# Patient Record
Sex: Female | Born: 1995 | Race: Black or African American | Hispanic: No | Marital: Single | Smoking: Current every day smoker
Health system: Southern US, Community
[De-identification: ages and names within clinical notes are randomized; demographics above are authoritative.]

## PROBLEM LIST (undated history)

## (undated) ENCOUNTER — Inpatient Hospital Stay (HOSPITAL_COMMUNITY): Payer: Self-pay

## (undated) DIAGNOSIS — I10 Essential (primary) hypertension: Secondary | ICD-10-CM

## (undated) DIAGNOSIS — Z8669 Personal history of other diseases of the nervous system and sense organs: Secondary | ICD-10-CM

## (undated) HISTORY — PX: MOUTH SURGERY: SHX715

---
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
## 2013-11-30 ENCOUNTER — Encounter (HOSPITAL_COMMUNITY): Payer: Self-pay | Admitting: Emergency Medicine

## 2013-11-30 ENCOUNTER — Other Ambulatory Visit (HOSPITAL_COMMUNITY)
Admission: RE | Admit: 2013-11-30 | Discharge: 2013-11-30 | Disposition: A | Discharge status: Home / Self Care | Payer: Self-pay | Source: Ambulatory Visit | Attending: Family Medicine | Admitting: Family Medicine

## 2013-11-30 ENCOUNTER — Emergency Department (INDEPENDENT_AMBULATORY_CARE_PROVIDER_SITE_OTHER)
Admission: EM | Admit: 2013-11-30 | Discharge: 2013-11-30 | Disposition: A | Discharge status: Home / Self Care | Payer: Self-pay | Source: Home / Self Care | Attending: Family Medicine | Admitting: Family Medicine

## 2013-11-30 DIAGNOSIS — Z113 Encounter for screening for infections with a predominantly sexual mode of transmission: Secondary | ICD-10-CM | POA: Insufficient documentation

## 2013-11-30 DIAGNOSIS — N760 Acute vaginitis: Secondary | ICD-10-CM | POA: Insufficient documentation

## 2013-11-30 LAB — POCT URINALYSIS DIP (DEVICE)
BILIRUBIN URINE: NEGATIVE
Glucose, UA: NEGATIVE mg/dL
Hgb urine dipstick: NEGATIVE
Ketones, ur: NEGATIVE mg/dL
Nitrite: NEGATIVE
PH: 7 (ref 5.0–8.0)
Protein, ur: NEGATIVE mg/dL
Specific Gravity, Urine: 1.02 (ref 1.005–1.030)
Urobilinogen, UA: 0.2 mg/dL (ref 0.0–1.0)

## 2013-11-30 LAB — POCT PREGNANCY, URINE: Preg Test, Ur: NEGATIVE

## 2013-11-30 LAB — CERVICOVAGINAL ANCILLARY ONLY
WET PREP (BD AFFIRM): NEGATIVE
WET PREP (BD AFFIRM): NEGATIVE
Wet Prep (BD Affirm): NEGATIVE

## 2013-11-30 MED ORDER — METRONIDAZOLE 500 MG PO TABS: 500.0000 mg | ORAL_TABLET | Freq: Two times a day (BID) | ORAL | Status: DC

## 2013-11-30 NOTE — ED Notes (Signed)
C/o vag d/c and itchy onset 2-3 days D/c looks like "cottage cheese" Denies urinary sx, abd pain, fevers, chills Alert, no signs of acute distress.

## 2013-11-30 NOTE — ED Provider Notes (Signed)
Debra Becker is a 18 y.o. female who presents to Urgent Care today for Vaginal discharge. Patient has a 2-3 day history of itchy vaginal discharge. The discharge appears to be cottage cheeselike. No fevers or chills nausea vomiting or diarrhea. No pelvic pain. No urinary frequency or urgency. No treatment tried yet.   History reviewed. No pertinent past medical history. Past Surgical History  Procedure Laterality Date  . Mouth surgery     History  Substance Use Topics  . Smoking status: Never Smoker   . Smokeless tobacco: Not on file  . Alcohol Use: Yes   ROS as above Medications: No current facility-administered medications for this encounter.   Current Outpatient Prescriptions  Medication Sig Dispense Refill  . metroNIDAZOLE (FLAGYL) 500 MG tablet Take 1 tablet (500 mg total) by mouth 2 (two) times daily. 14 tablet 0   No Known Allergies   Exam:  BP 124/87 mmHg  Pulse 77  Temp(Src) 97.4 F (36.3 C) (Oral)  Resp 20  SpO2 98% Gen: Well NAD GYN: normal external genitalia. Vaginal canal with thin white discharge. Normal-appearing cervix. Nontender.  No results found for this or any previous visit (from the past 24 hour(s)). No results found.  Assessment and Plan: 18 y.o. female with vaginitis likely BV based on appearance of discharge. Cytology pending. Treatment with Flagyl.  Discussed warning signs or symptoms. Please see discharge instructions. Patient expresses understanding.     Rodolph BongEvan S Elliona Doddridge, MD 11/30/13 [phone]

## 2013-11-30 NOTE — Discharge Instructions (Signed)
Thank you for coming in today. We will call you if anything comes back positive that we are not treating for or is contagious. Take metronidazole twice daily for one week. Do not take with alcohol. If your belly pain worsens, or you have high fever, bad vomiting, blood in your stool or black tarry stool go to the Emergency Room.    Bacterial Vaginosis Bacterial vaginosis is a vaginal infection that occurs when the normal balance of bacteria in the vagina is disrupted. It results from an overgrowth of certain bacteria. This is the most common vaginal infection in women of childbearing age. Treatment is important to prevent complications, especially in pregnant women, as it can cause a premature delivery. CAUSES  Bacterial vaginosis is caused by an increase in harmful bacteria that are normally present in smaller amounts in the vagina. Several different kinds of bacteria can cause bacterial vaginosis. However, the reason that the condition develops is not fully understood. RISK FACTORS Certain activities or behaviors can put you at an increased risk of developing bacterial vaginosis, including:  Having a new sex partner or multiple sex partners.  Douching.  Using an intrauterine device (IUD) for contraception. Women do not get bacterial vaginosis from toilet seats, bedding, swimming pools, or contact with objects around them. SIGNS AND SYMPTOMS  Some women with bacterial vaginosis have no signs or symptoms. Common symptoms include:  Grey vaginal discharge.  A fishlike odor with discharge, especially after sexual intercourse.  Itching or burning of the vagina and vulva.  Burning or pain with urination. DIAGNOSIS  Your health care provider will take a medical history and examine the vagina for signs of bacterial vaginosis. A sample of vaginal fluid may be taken. Your health care provider will look at this sample under a microscope to check for bacteria and abnormal cells. A vaginal pH test  may also be done.  TREATMENT  Bacterial vaginosis may be treated with antibiotic medicines. These may be given in the form of a pill or a vaginal cream. A second round of antibiotics may be prescribed if the condition comes back after treatment.  HOME CARE INSTRUCTIONS   Only take over-the-counter or prescription medicines as directed by your health care provider.  If antibiotic medicine was prescribed, take it as directed. Make sure you finish it even if you start to feel better.  Do not have sex until treatment is completed.  Tell all sexual partners that you have a vaginal infection. They should see their health care provider and be treated if they have problems, such as a mild rash or itching.  Practice safe sex by using condoms and only having one sex partner. SEEK MEDICAL CARE IF:   Your symptoms are not improving after 3 days of treatment.  You have increased discharge or pain.  You have a fever. MAKE SURE YOU:   Understand these instructions.  Will watch your condition.  Will get help right away if you are not doing well or get worse. FOR MORE INFORMATION  Centers for Disease Control and Prevention, Division of STD Prevention: SolutionApps.co.zawww.cdc.gov/std American Sexual Health Association (ASHA): www.ashastd.org  Document Released: 01/13/2005 Document Revised: 11/03/2012 Document Reviewed: 08/25/2012 [facility] Patient Information 2015 [place], [place]. This information is not intended to replace advice given to you by your health care provider. Make sure you discuss any questions you have with your health care provider.

## 2013-12-01 LAB — CERVICOVAGINAL ANCILLARY ONLY
Chlamydia: POSITIVE — AB
Neisseria Gonorrhea: NEGATIVE

## 2013-12-02 ENCOUNTER — Telehealth (HOSPITAL_COMMUNITY): Payer: Self-pay | Admitting: *Deleted

## 2013-12-02 NOTE — ED Notes (Signed)
GC neg., Chlamydia pos.,  Affirm: Candida, Trich and Gardnerella neg.  Pt. needs treatment.  11/5 Message sent to Dr. Denyse Amassorey.  He wrote back that he tried to call pt., but number is not in service. He asked for a letter to be sent to the pt. I tried calling today as well.  Contacts number gives a fast busy signal.  Will send letter. Vassie MoselleYork, Debra Becker 12/02/2013

## 2013-12-16 ENCOUNTER — Emergency Department (HOSPITAL_COMMUNITY)
Admission: EM | Admit: 2013-12-16 | Discharge: 2013-12-16 | Disposition: A | Discharge status: Home / Self Care | Payer: Self-pay | Attending: Emergency Medicine | Admitting: Emergency Medicine

## 2013-12-16 ENCOUNTER — Encounter (HOSPITAL_COMMUNITY): Payer: Self-pay | Admitting: Emergency Medicine

## 2013-12-16 DIAGNOSIS — R55 Syncope and collapse: Secondary | ICD-10-CM | POA: Insufficient documentation

## 2013-12-16 DIAGNOSIS — R42 Dizziness and giddiness: Secondary | ICD-10-CM | POA: Insufficient documentation

## 2013-12-16 LAB — CBC WITH DIFFERENTIAL/PLATELET
BASOS ABS: 0 10*3/uL (ref 0.0–0.1)
Basophils Relative: 0 % (ref 0–1)
EOS ABS: 0.2 10*3/uL (ref 0.0–0.7)
EOS PCT: 1 % (ref 0–5)
HCT: 41.5 % (ref 36.0–46.0)
Hemoglobin: 14.1 g/dL (ref 12.0–15.0)
Lymphocytes Relative: 29 % (ref 12–46)
Lymphs Abs: 3.3 10*3/uL (ref 0.7–4.0)
MCH: 27.5 pg (ref 26.0–34.0)
MCHC: 34 g/dL (ref 30.0–36.0)
MCV: 80.9 fL (ref 78.0–100.0)
Monocytes Absolute: 0.6 10*3/uL (ref 0.1–1.0)
Monocytes Relative: 5 % (ref 3–12)
NEUTROS PCT: 65 % (ref 43–77)
Neutro Abs: 7.4 10*3/uL (ref 1.7–7.7)
PLATELETS: 301 10*3/uL (ref 150–400)
RBC: 5.13 MIL/uL — ABNORMAL HIGH (ref 3.87–5.11)
RDW: 12.8 % (ref 11.5–15.5)
WBC: 11.5 10*3/uL — AB (ref 4.0–10.5)

## 2013-12-16 LAB — COMPREHENSIVE METABOLIC PANEL
ALBUMIN: 3.5 g/dL (ref 3.5–5.2)
ALK PHOS: 77 U/L (ref 39–117)
ALT: 21 U/L (ref 0–35)
AST: 20 U/L (ref 0–37)
Anion gap: 14 (ref 5–15)
BUN: 8 mg/dL (ref 6–23)
CALCIUM: 9 mg/dL (ref 8.4–10.5)
CO2: 22 mEq/L (ref 19–32)
Chloride: 101 mEq/L (ref 96–112)
Creatinine, Ser: 0.88 mg/dL (ref 0.50–1.10)
GFR calc Af Amer: >90 mL/min (ref >90)
GFR calc non Af Amer: >90 mL/min (ref >90)
GLUCOSE: 113 mg/dL — AB (ref 70–99)
POTASSIUM: 3.9 meq/L (ref 3.7–5.3)
SODIUM: 137 meq/L (ref 137–147)
TOTAL PROTEIN: 6.9 g/dL (ref 6.0–8.3)
Total Bilirubin: 0.8 mg/dL (ref 0.3–1.2)

## 2013-12-16 LAB — CBG MONITORING, ED: Glucose-Capillary: 111 mg/dL — ABNORMAL HIGH (ref 70–99)

## 2013-12-16 MED ORDER — SODIUM CHLORIDE 0.9 % IV BOLUS (SEPSIS)
1000.0000 mL | Freq: Once | INTRAVENOUS | Status: AC
  Administered 2013-12-16: 1000 mL via INTRAVENOUS

## 2013-12-16 NOTE — ED Notes (Signed)
Pt. had a syncopal episode at work this evening , pt. stated she felt dizzy and lightheaded before passing out , pt. reported she has not eaten a full meal since yesterday afternoon " I'm just eating chips and water " , alert and oriented .

## 2013-12-16 NOTE — ED Provider Notes (Signed)
CSN: [id number]     Arrival date & time 12/16/13  1930 History   First MD Initiated Contact with Patient 12/16/13 1950     Chief Complaint  Patient presents with  . Loss of Consciousness      HPI  Patient presents for evaluation of "passing out". Patient states she hasn't eaten anything other than water and potato chips since yesterday at noon. 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 She is sitting in a chair room. She states she feels lightheaded. She started to stand up. She had to sit back down and states she "passed out in the chair".  No nausea vomiting diarrhea. No recent fevers or chills. No chest pain or shortness of breath. Denies pregnancy. No vaginal bleeding now or recent.  History reviewed. No pertinent past medical history. Past Surgical History  Procedure Laterality Date  . Mouth surgery     No family history on file. History  Substance Use Topics  . Smoking status: Never Smoker   . Smokeless tobacco: Not on file  . Alcohol Use: Yes   OB History    No data available     Review of Systems  Constitutional: Negative for fever, chills, diaphoresis, appetite change and fatigue.  HENT: Negative for mouth sores, sore throat and trouble swallowing.   Eyes: Negative for visual disturbance.  Respiratory: Negative for cough, chest tightness, shortness of breath and wheezing.   Cardiovascular: Negative for chest pain.  Gastrointestinal: Negative for nausea, vomiting, abdominal pain, diarrhea and abdominal distention.  Endocrine: Negative for polydipsia, polyphagia and polyuria.  Genitourinary: Negative for dysuria, frequency and hematuria.  Musculoskeletal: Negative for gait problem.  Skin: Negative for color change, pallor and rash.  Neurological: Positive for dizziness and light-headedness. Negative for syncope and headaches.  Hematological: Does not bruise/bleed easily.  Psychiatric/Behavioral: Negative for behavioral problems and  confusion.      Allergies  Review of patient's allergies indicates no known allergies.  Home Medications   Prior to Admission medications   Medication Sig Start Date End Date Taking? Authorizing Provider  etonogestrel (IMPLANON) 68 MG IMPL implant 1 each by Subdermal route once. 2015   Yes Historical Provider, MD  metroNIDAZOLE (FLAGYL) 500 MG tablet Take 1 tablet (500 mg total) by mouth 2 (two) times daily. Patient not taking: Reported on 12/16/2013 11/30/13   Rodolph BongEvan S Corey, MD   BP 143/83 mmHg  Pulse 96  Temp(Src) 97.8 F (36.6 C) (Oral)  Resp 20  Ht 5\' 9"  (1.753 m)  Wt 287 lb (130.182 kg)  BMI 42.36 kg/m2  SpO2 100% Physical Exam  Constitutional: She is oriented to person, place, and time. She appears well-developed and well-nourished. No distress.  HENT:  Head: Normocephalic.  Jugular not pale. Oropharynx pink and moist.  Eyes: Conjunctivae are normal. Pupils are equal, round, and reactive to light. No scleral icterus.  Neck: Normal range of motion. Neck supple. No thyromegaly present.  Cardiovascular: Normal rate and regular rhythm.  Exam reveals no gallop and no friction rub.   No murmur heard. Pulmonary/Chest: Effort normal and breath sounds normal. No respiratory distress. She has no wheezes. She has no rales.  Abdominal: Soft. Bowel sounds are normal. She exhibits no distension. There is no tenderness. There is no rebound.  Musculoskeletal: Normal range of motion.  Neurological: She is alert and oriented to person, place, and time.  Skin: Skin is warm and dry. No rash noted.  Psychiatric: She has a normal mood and  affect. Her behavior is normal.    ED Course  Procedures (including critical care time) Labs Review Labs Reviewed  CBC WITH DIFFERENTIAL - Abnormal; Notable for the following:    WBC 11.5 (*)    RBC 5.13 (*)    All other components within normal limits  COMPREHENSIVE METABOLIC PANEL - Abnormal; Notable for the following:    Glucose, Bld 113 (*)     All other components within normal limits  CBG MONITORING, ED - Abnormal; Notable for the following:    Glucose-Capillary 111 (*)    All other components within normal limits    Imaging Review No results found.   EKG Interpretation None      MDM   Final diagnoses:  Syncope, non cardiac    No vertigo. A symptomatically. Mandatory. Will be given some fluids as her systolic was 97. This is volume depletion secondary to poor by mouth intake and plasma donation.    Rolland PorterMark Theresia Pree, MD 12/16/13 2105

## 2013-12-16 NOTE — Discharge Instructions (Signed)
Stay hydrated Don't skip meals Hydrate your self more aggressively on days that you plan on donating plasma. Do not donate for the next several days  Syncope Syncope means a person passes out (faints). The person usually wakes up in less than 5 minutes. It is important to seek medical care for syncope. HOME CARE  Have someone stay with you until you feel normal.  Do not drive, use machines, or play sports until your doctor says it is okay.  Keep all doctor visits as told.  Lie down when you feel like you might pass out. Take deep breaths. Wait until you feel normal before standing up.  Drink enough fluids to keep your pee (urine) clear or pale yellow.  If you take blood pressure or heart medicine, get up slowly. Take several minutes to sit and then stand. GET HELP RIGHT AWAY IF:   You have a severe headache.  You have pain in the chest, belly (abdomen), or back.  You are bleeding from the mouth or butt (rectum).  You have black or tarry poop (stool).  You have an irregular or very fast heartbeat.  You have pain with breathing.  You keep passing out, or you have shaking (seizures) when you pass out.  You pass out when sitting or lying down.  You feel confused.  You have trouble walking.  You have severe weakness.  You have vision problems. If you fainted, call your local emergency services (911 in [place] Do not drive yourself to the hospital. MAKE SURE YOU:   Understand these instructions.  Will watch your condition.  Will get help right away if you are not doing well or get worse. Document Released: 07/02/2007 Document Revised: 07/15/2011 Document Reviewed: 03/14/2011 [facility] CorpExitCare Patient Information 2015 [place], [place]. This information is not intended to replace advice given to you by your health care provider. Make sure you discuss any questions you have with your health care provider.

## 2014-03-13 ENCOUNTER — Emergency Department (HOSPITAL_COMMUNITY): Payer: Self-pay

## 2014-03-13 ENCOUNTER — Encounter (HOSPITAL_COMMUNITY): Payer: Self-pay

## 2014-03-13 ENCOUNTER — Emergency Department (HOSPITAL_COMMUNITY)
Admission: EM | Admit: 2014-03-13 | Discharge: 2014-03-13 | Disposition: A | Discharge status: Home / Self Care | Payer: Self-pay | Attending: Emergency Medicine | Admitting: Emergency Medicine

## 2014-03-13 DIAGNOSIS — S59902A Unspecified injury of left elbow, initial encounter: Secondary | ICD-10-CM | POA: Insufficient documentation

## 2014-03-13 DIAGNOSIS — S6992XA Unspecified injury of left wrist, hand and finger(s), initial encounter: Secondary | ICD-10-CM | POA: Insufficient documentation

## 2014-03-13 DIAGNOSIS — S299XXA Unspecified injury of thorax, initial encounter: Secondary | ICD-10-CM | POA: Insufficient documentation

## 2014-03-13 DIAGNOSIS — S3992XA Unspecified injury of lower back, initial encounter: Secondary | ICD-10-CM | POA: Insufficient documentation

## 2014-03-13 DIAGNOSIS — Y998 Other external cause status: Secondary | ICD-10-CM | POA: Insufficient documentation

## 2014-03-13 DIAGNOSIS — S199XXA Unspecified injury of neck, initial encounter: Secondary | ICD-10-CM | POA: Insufficient documentation

## 2014-03-13 DIAGNOSIS — R079 Chest pain, unspecified: Secondary | ICD-10-CM

## 2014-03-13 DIAGNOSIS — S3991XA Unspecified injury of abdomen, initial encounter: Secondary | ICD-10-CM | POA: Insufficient documentation

## 2014-03-13 DIAGNOSIS — Y92410 Unspecified street and highway as the place of occurrence of the external cause: Secondary | ICD-10-CM | POA: Insufficient documentation

## 2014-03-13 DIAGNOSIS — M25562 Pain in left knee: Secondary | ICD-10-CM

## 2014-03-13 DIAGNOSIS — S8992XA Unspecified injury of left lower leg, initial encounter: Secondary | ICD-10-CM | POA: Insufficient documentation

## 2014-03-13 DIAGNOSIS — T1490XA Injury, unspecified, initial encounter: Secondary | ICD-10-CM

## 2014-03-13 DIAGNOSIS — Y9389 Activity, other specified: Secondary | ICD-10-CM | POA: Insufficient documentation

## 2014-03-13 DIAGNOSIS — Z792 Long term (current) use of antibiotics: Secondary | ICD-10-CM | POA: Insufficient documentation

## 2014-03-13 LAB — CBC
HCT: 38.9 % (ref 36.0–46.0)
HEMOGLOBIN: 12.5 g/dL (ref 12.0–15.0)
MCH: 26.6 pg (ref 26.0–34.0)
MCHC: 32.1 g/dL (ref 30.0–36.0)
MCV: 82.8 fL (ref 78.0–100.0)
Platelets: 296 10*3/uL (ref 150–400)
RBC: 4.7 MIL/uL (ref 3.87–5.11)
RDW: 13.5 % (ref 11.5–15.5)
WBC: 12.7 10*3/uL — ABNORMAL HIGH (ref 4.0–10.5)

## 2014-03-13 LAB — COMPREHENSIVE METABOLIC PANEL
ALBUMIN: 4.2 g/dL (ref 3.5–5.2)
ALT: 25 U/L (ref 0–35)
ANION GAP: 7 (ref 5–15)
AST: 24 U/L (ref 0–37)
Alkaline Phosphatase: 72 U/L (ref 39–117)
BUN: 11 mg/dL (ref 6–23)
CALCIUM: 9.3 mg/dL (ref 8.4–10.5)
CHLORIDE: 109 mmol/L (ref 96–112)
CO2: 25 mmol/L (ref 19–32)
CREATININE: 0.83 mg/dL (ref 0.50–1.10)
GFR calc Af Amer: >90 mL/min (ref >90)
Glucose, Bld: 109 mg/dL — ABNORMAL HIGH (ref 70–99)
POTASSIUM: 3.7 mmol/L (ref 3.5–5.1)
SODIUM: 141 mmol/L (ref 135–145)
Total Bilirubin: 0.9 mg/dL (ref 0.3–1.2)
Total Protein: 7.6 g/dL (ref 6.0–8.3)

## 2014-03-13 LAB — ETHANOL

## 2014-03-13 LAB — I-STAT BETA HCG BLOOD, ED (MC, WL, AP ONLY): I-stat hCG, quantitative: <5 m[IU]/mL (ref <5)

## 2014-03-13 LAB — PROTIME-INR
INR: 1.07 (ref 0.00–1.49)
PROTHROMBIN TIME: 14 s (ref 11.6–15.2)

## 2014-03-13 LAB — SAMPLE TO BLOOD BANK

## 2014-03-13 MED ORDER — MORPHINE SULFATE 4 MG/ML IJ SOLN
4.0000 mg | Freq: Once | INTRAMUSCULAR | Status: AC
  Administered 2014-03-13: 4 mg via INTRAVENOUS
  Filled 2014-03-13: qty 1

## 2014-03-13 MED ORDER — ONDANSETRON HCL 4 MG/2ML IJ SOLN
4.0000 mg | Freq: Once | INTRAMUSCULAR | Status: AC
  Administered 2014-03-13: 4 mg via INTRAVENOUS
  Filled 2014-03-13: qty 2

## 2014-03-13 MED ORDER — IOHEXOL 300 MG/ML  SOLN
100.0000 mL | Freq: Once | INTRAMUSCULAR | Status: AC | PRN
  Administered 2014-03-13: 100 mL via INTRAVENOUS

## 2014-03-13 MED ORDER — HYDROCODONE-ACETAMINOPHEN 5-325 MG PO TABS: 2.0000 | ORAL_TABLET | Freq: Four times a day (QID) | ORAL | Status: AC | PRN
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
## 2014-03-13 NOTE — ED Notes (Signed)
Patient transported to CT 
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
## 2014-03-13 NOTE — Discharge Instructions (Signed)

## 2014-03-16 ENCOUNTER — Emergency Department (HOSPITAL_COMMUNITY)
Admission: EM | Admit: 2014-03-16 | Discharge: 2014-03-16 | Discharge status: Absent without leave | Payer: Self-pay | Source: Home / Self Care

## 2014-04-05 ENCOUNTER — Encounter (HOSPITAL_COMMUNITY): Payer: Self-pay | Admitting: Emergency Medicine

## 2014-04-05 ENCOUNTER — Emergency Department (HOSPITAL_COMMUNITY)
Admission: EM | Admit: 2014-04-05 | Discharge: 2014-04-05 | Disposition: A | Discharge status: Home / Self Care | Payer: Self-pay | Attending: Emergency Medicine | Admitting: Emergency Medicine

## 2014-04-05 ENCOUNTER — Emergency Department (HOSPITAL_COMMUNITY): Payer: Self-pay

## 2014-04-05 DIAGNOSIS — J069 Acute upper respiratory infection, unspecified: Secondary | ICD-10-CM | POA: Insufficient documentation

## 2014-04-05 DIAGNOSIS — B9789 Other viral agents as the cause of diseases classified elsewhere: Secondary | ICD-10-CM

## 2014-04-05 DIAGNOSIS — R0789 Other chest pain: Secondary | ICD-10-CM | POA: Insufficient documentation

## 2014-04-05 DIAGNOSIS — Z79899 Other long term (current) drug therapy: Secondary | ICD-10-CM | POA: Insufficient documentation

## 2014-04-05 LAB — CBC
HEMATOCRIT: 36 % (ref 36.0–46.0)
HEMOGLOBIN: 11.9 g/dL — AB (ref 12.0–15.0)
MCH: 27.5 pg (ref 26.0–34.0)
MCHC: 33.1 g/dL (ref 30.0–36.0)
MCV: 83.3 fL (ref 78.0–100.0)
Platelets: 265 10*3/uL (ref 150–400)
RBC: 4.32 MIL/uL (ref 3.87–5.11)
RDW: 13.7 % (ref 11.5–15.5)
WBC: 10.1 10*3/uL (ref 4.0–10.5)

## 2014-04-05 LAB — I-STAT TROPONIN, ED: Troponin i, poc: 0 ng/mL (ref 0.00–0.08)

## 2014-04-05 LAB — BASIC METABOLIC PANEL
ANION GAP: 7 (ref 5–15)
BUN: 12 mg/dL (ref 6–23)
CALCIUM: 8.9 mg/dL (ref 8.4–10.5)
CO2: 22 mmol/L (ref 19–32)
CREATININE: 0.86 mg/dL (ref 0.50–1.10)
Chloride: 109 mmol/L (ref 96–112)
GFR calc Af Amer: >90 mL/min (ref >90)
GFR calc non Af Amer: >90 mL/min (ref >90)
Glucose, Bld: 108 mg/dL — ABNORMAL HIGH (ref 70–99)
Potassium: 3.7 mmol/L (ref 3.5–5.1)
Sodium: 138 mmol/L (ref 135–145)

## 2014-04-05 MED ORDER — PREDNISONE 20 MG PO TABS: 40.0000 mg | ORAL_TABLET | Freq: Every day | ORAL | Status: DC

## 2014-04-05 MED ORDER — GI COCKTAIL ~~LOC~~
30.0000 mL | Freq: Once | ORAL | Status: AC
  Administered 2014-04-05: 30 mL via ORAL
  Filled 2014-04-05: qty 30

## 2014-04-05 MED ORDER — ALBUTEROL SULFATE HFA 108 (90 BASE) MCG/ACT IN AERS
2.0000 | INHALATION_SPRAY | Freq: Once | RESPIRATORY_TRACT | Status: AC
  Administered 2014-04-05: 2 via RESPIRATORY_TRACT
  Filled 2014-04-05: qty 6.7

## 2014-04-05 MED ORDER — FAMOTIDINE 20 MG PO TABS: 20.0000 mg | ORAL_TABLET | Freq: Two times a day (BID) | ORAL | Status: AC

## 2014-04-05 NOTE — ED Notes (Signed)
Pt has had flu like symptoms for the past 3 weeks, tonight she woke up short of breath with chest pressure.  Chest pressure is relieved when the pt sits up, pt denies being short of breath at present.  Denies N/V.

## 2014-04-05 NOTE — ED Notes (Signed)
Pt verbalized understanding of d/c instructions and has no further questions.  

## 2014-04-05 NOTE — Discharge Instructions (Signed)
Your workup today did not reveal any pneumonia. Your heart workup is also normal. Recommend that you take Pepcid as prescribed as well as prednisone. You may use an albuterol inhaler, 2 puffs every 4 hours, as needed for chest tightness/cough/shortness of breath. Follow-up with your primary doctor in 48 hours for a recheck of symptoms. You may return to the emergency department as needed if symptoms worsen.  Upper Respiratory Infection, Adult An upper respiratory infection (URI) is also sometimes known as the common cold. The upper respiratory tract includes the nose, sinuses, throat, trachea, and bronchi. Bronchi are the airways leading to the lungs. Most people improve within 1 week, but symptoms can last up to 2 weeks. A residual cough may last even longer.  CAUSES Many different viruses can infect the tissues lining the upper respiratory tract. The tissues become irritated and inflamed and often become very moist. Mucus production is also common. A cold is contagious. You can easily spread the virus to others by oral contact. This includes kissing, sharing a glass, coughing, or sneezing. Touching your mouth or nose and then touching a surface, which is then touched by another person, can also spread the virus. SYMPTOMS  Symptoms typically develop 1 to 3 days after you come in contact with a cold virus. Symptoms vary from person to person. They may include:  Runny nose.  Sneezing.  Nasal congestion.  Sinus irritation.  Sore throat.  Loss of voice (laryngitis).  Cough.  Fatigue.  Muscle aches.  Loss of appetite.  Headache.  Low-grade fever. DIAGNOSIS  You might diagnose your own cold based on familiar symptoms, since most people get a cold 2 to 3 times a year. Your caregiver can confirm this based on your exam. Most importantly, your caregiver can check that your symptoms are not due to another disease such as strep throat, sinusitis, pneumonia, asthma, or epiglottitis. Blood  tests, throat tests, and X-rays are not necessary to diagnose a common cold, but they may sometimes be helpful in excluding other more serious diseases. Your caregiver will decide if any further tests are required. RISKS AND COMPLICATIONS  You may be at risk for a more severe case of the common cold if you smoke cigarettes, have chronic heart disease (such as heart failure) or lung disease (such as asthma), or if you have a weakened immune system. The very young and very old are also at risk for more serious infections. Bacterial sinusitis, middle ear infections, and bacterial pneumonia can complicate the common cold. The common cold can worsen asthma and chronic obstructive pulmonary disease (COPD). Sometimes, these complications can require emergency medical care and may be life-threatening. PREVENTION  The best way to protect against getting a cold is to practice good hygiene. Avoid oral or hand contact with people with cold symptoms. Wash your hands often if contact occurs. There is no clear evidence that vitamin C, vitamin E, echinacea, or exercise reduces the chance of developing a cold. However, it is always recommended to get plenty of rest and practice good nutrition. TREATMENT  Treatment is directed at relieving symptoms. There is no cure. Antibiotics are not effective, because the infection is caused by a virus, not by bacteria. Treatment may include:  Increased fluid intake. Sports drinks offer valuable electrolytes, sugars, and fluids.  Breathing heated mist or steam (vaporizer or shower).  Eating chicken soup or other clear broths, and maintaining good nutrition.  Getting plenty of rest.  Using gargles or lozenges for comfort.  Controlling fevers with  ibuprofen or acetaminophen as directed by your caregiver.  Increasing usage of your inhaler if you have asthma. Zinc gel and zinc lozenges, taken in the first 24 hours of the common cold, can shorten the duration and lessen the  severity of symptoms. Pain medicines may help with fever, muscle aches, and throat pain. A variety of non-prescription medicines are available to treat congestion and runny nose. Your caregiver can make recommendations and may suggest nasal or lung inhalers for other symptoms.  HOME CARE INSTRUCTIONS   Only take over-the-counter or prescription medicines for pain, discomfort, or fever as directed by your caregiver.  Use a warm mist humidifier or inhale steam from a shower to increase air moisture. This may keep secretions moist and make it easier to breathe.  Drink enough water and fluids to keep your urine clear or pale yellow.  Rest as needed.  Return to work when your temperature has returned to normal or as your caregiver advises. You may need to stay home longer to avoid infecting others. You can also use a face mask and careful hand washing to prevent spread of the virus. SEEK MEDICAL CARE IF:   After the first few days, you feel you are getting worse rather than better.  You need your caregiver's advice about medicines to control symptoms.  You develop chills, worsening shortness of breath, or brown or red sputum. These may be signs of pneumonia.  You develop yellow or brown nasal discharge or pain in the face, especially when you bend forward. These may be signs of sinusitis.  You develop a fever, swollen neck glands, pain with swallowing, or white areas in the back of your throat. These may be signs of strep throat. SEEK IMMEDIATE MEDICAL CARE IF:   You have a fever.  You develop severe or persistent headache, ear pain, sinus pain, or chest pain.  You develop wheezing, a prolonged cough, cough up blood, or have a change in your usual mucus (if you have chronic lung disease).  You develop sore muscles or a stiff neck. Document Released: 07/09/2000 Document Revised: 04/07/2011 Document Reviewed: 04/20/2013 [facility] Patient Information 2015 [place]. This information is  not intended to replace advice given to you by your health care provider. Make sure you discuss any questions you have with your health care provider.  Gastroesophageal Reflux Disease, Adult Gastroesophageal reflux disease (GERD) happens when acid from your stomach flows up into the esophagus. When acid comes in contact with the esophagus, the acid causes soreness (inflammation) in the esophagus. Over time, GERD may create small holes (ulcers) in the lining of the esophagus. CAUSES   Increased body weight. This puts pressure on the stomach, making acid rise from the stomach into the esophagus.  Smoking. This increases acid production in the stomach.  Drinking alcohol. This causes decreased pressure in the lower esophageal sphincter (valve or ring of muscle between the esophagus and stomach), allowing acid from the stomach into the esophagus.  Late evening meals and a full stomach. This increases pressure and acid production in the stomach.  A malformed lower esophageal sphincter. Sometimes, no cause is found. SYMPTOMS   Burning pain in the lower part of the mid-chest behind the breastbone and in the mid-stomach area. This may occur twice a week or more often.  Trouble swallowing.  Sore throat.  Dry cough.  Asthma-like symptoms including chest tightness, shortness of breath, or wheezing. DIAGNOSIS  Your caregiver may be able to diagnose GERD based on your symptoms. In  some cases, X-rays and other tests may be done to check for complications or to check the condition of your stomach and esophagus. TREATMENT  Your caregiver may recommend over-the-counter or prescription medicines to help decrease acid production. Ask your caregiver before starting or adding any new medicines.  HOME CARE INSTRUCTIONS   Change the factors that you can control. Ask your caregiver for guidance concerning weight loss, quitting smoking, and alcohol consumption.  Avoid foods and drinks that make your  symptoms worse, such as:  Caffeine or alcoholic drinks.  Chocolate.  Peppermint or mint flavorings.  Garlic and onions.  Spicy foods.  Citrus fruits, such as oranges, lemons, or limes.  Tomato-based foods such as sauce, chili, salsa, and pizza.  Fried and fatty foods.  Avoid lying down for the 3 hours prior to your bedtime or prior to taking a nap.  Eat small, frequent meals instead of large meals.  Wear loose-fitting clothing. Do not wear anything tight around your waist that causes pressure on your stomach.  Raise the head of your bed 6 to 8 inches with wood blocks to help you sleep. Extra pillows will not help.  Only take over-the-counter or prescription medicines for pain, discomfort, or fever as directed by your caregiver.  Do not take aspirin, ibuprofen, or other nonsteroidal anti-inflammatory drugs (NSAIDs). SEEK IMMEDIATE MEDICAL CARE IF:   You have pain in your arms, neck, jaw, teeth, or back.  Your pain increases or changes in intensity or duration.  You develop nausea, vomiting, or sweating (diaphoresis).  You develop shortness of breath, or you faint.  Your vomit is green, yellow, black, or looks like coffee grounds or blood.  Your stool is red, bloody, or black. These symptoms could be signs of other problems, such as heart disease, gastric bleeding, or esophageal bleeding. MAKE SURE YOU:   Understand these instructions.  Will watch your condition.  Will get help right away if you are not doing well or get worse. Document Released: 10/23/2004 Document Revised: 04/07/2011 Document Reviewed: 08/02/2010 [facility] Patient Information 2015 [place], [place]. This information is not intended to replace advice given to you by your health care provider. Make sure you discuss any questions you have with your health care provider.

## 2014-04-05 NOTE — ED Notes (Addendum)
While ambulating pt, pt had mo complaints, pt stated that she felt normal. O2 sat stayed at 97% the whole time.

## 2014-04-05 NOTE — ED Provider Notes (Signed)
CSN: [id number]     Arrival date & time 04/05/14  0144 History   First MD Initiated Contact with Patient 04/05/14 0232     Chief Complaint  Patient presents with  . Chest Pain    (Consider location/radiation/quality/duration/timing/severity/associated sxs/prior Treatment) HPI Comments: Patient is a 19 year old female with no significant past medical history who presents to the emergency department for further evaluation of chest pain and tightness. Patient states that she has been experiencing flulike symptoms over the past 3 weeks, characterized by nasal congestion/sinus pressure, and a dry, nonproductive cough. She states that she had a headache so she laid down to sleep. She states that she was awoken by the sensation of tightening in her chest as well as shortness of breath. She also describes the pain as "burning" in nature; "similar to how it felt when I had reflux when I was pregnant". Patient states her symptoms have spontaneously improved since onset. She denies any medications taken prior to arrival. Patient further denies associated fever, syncope, hemoptysis, leg swelling, vomiting, and diarrhea. Patient reports sick contacts at home. Her mother has been sick with bronchitis and her son has been sick with a URI.  Patient is a 19 y.o. female presenting with chest pain. The history is provided by the patient. No language interpreter was used.  Chest Pain Associated symptoms: cough and shortness of breath   Associated symptoms: no fever and not vomiting     History reviewed. No pertinent past medical history. Past Surgical History  Procedure Laterality Date  . Mouth surgery     No family history on file. History  Substance Use Topics  . Smoking status: Never Smoker   . Smokeless tobacco: Not on file  . Alcohol Use: Yes   OB History    No data available      Review of Systems  Constitutional: Negative for fever.  HENT: Positive for congestion and sinus pressure.    Respiratory: Positive for cough, chest tightness and shortness of breath.   Cardiovascular: Positive for chest pain.  Gastrointestinal: Negative for vomiting and diarrhea.  All other systems reviewed and are negative.   Allergies  Review of patient's allergies indicates no known allergies.  Home Medications   Prior to Admission medications   Medication Sig Start Date End Date Taking? Authorizing Provider  etonogestrel (IMPLANON) 68 MG IMPL implant 1 each by Subdermal route once. 2015    Historical Provider, MD  HYDROcodone-acetaminophen (NORCO/VICODIN) 5-325 MG per tablet Take 2 tablets by mouth every 6 (six) hours as needed for moderate pain or severe pain. 03/13/14   Roxy Horsemanobert Browning, PA-C  metroNIDAZOLE (FLAGYL) 500 MG tablet Take 1 tablet (500 mg total) by mouth 2 (two) times daily. Patient not taking: Reported on 12/16/2013 11/30/13   Rodolph BongEvan S Corey, MD   BP 144/67 mmHg  Pulse 80  Temp(Src) 98.1 F (36.7 C) (Oral)  Resp 20  Ht 5\' 9"  (1.753 m)  Wt 279 lb (126.554 kg)  BMI 41.18 kg/m2  SpO2 99%   Physical Exam  Constitutional: She is oriented to person, place, and time. She appears well-developed and well-nourished. No distress.  Nontoxic/nonseptic appearing  HENT:  Head: Normocephalic and atraumatic.  Mouth/Throat: Oropharynx is clear and moist. No oropharyngeal exudate.  Audible nasal congestion  Eyes: Conjunctivae and EOM are normal. No scleral icterus.  Neck: Normal range of motion.  No JVD  Cardiovascular: Normal rate, regular rhythm and normal heart sounds.   Pulmonary/Chest: Effort normal and breath sounds normal. No respiratory  distress. She has no wheezes. She has no rales.  Respirations even and unlabored. Lungs clear.   Musculoskeletal: Normal range of motion.  Neurological: She is alert and oriented to person, place, and time. She exhibits normal muscle tone. Coordination normal.  GCS 15. Patient moves extremities without ataxia  Skin: Skin is warm and dry. No  rash noted. She is not diaphoretic. No erythema. No pallor.  Psychiatric: She has a normal mood and affect. Her behavior is normal.  Nursing note and vitals reviewed.   ED Course  Procedures (including critical care time) Labs Review Labs Reviewed  CBC - Abnormal; Notable for the following:    Hemoglobin 11.9 (*)    All other components within normal limits  BASIC METABOLIC PANEL  I-STAT TROPOININ, ED    Imaging Review Dg Chest 2 View  04/05/2014   CLINICAL DATA:  Chest pain and shortness of breath. Chest tightness tonight.  EXAM: CHEST  2 VIEW  COMPARISON:  03/13/2014  FINDINGS: The heart size and mediastinal contours are within normal limits. Both lungs are clear. The visualized skeletal structures are unremarkable.  IMPRESSION: No active cardiopulmonary disease.   Electronically Signed   By: Burman Nieves M.D.   On: 04/05/2014 02:18     EKG Interpretation   Date/Time:  Wednesday April 05 2014 01:48:38 EST Ventricular Rate:  80 PR Interval:  164 QRS Duration: 88 QT Interval:  382 QTC Calculation: 440 R Axis:   89 Text Interpretation:  Normal sinus rhythm T wave abnormality, consider  inferior ischemia No old tracing to compare Confirmed by KNAPP  MD-I, IVA  ([id number]) on 04/05/2014 3:29:57 AM      MDM   Final diagnoses:  Chest tightness  Viral URI with cough    Patient is a 19 year old female who presents to the emergency department for further evaluation of chest tightness and shortness of breath which began while sleeping this evening. Patient also describes a burning sensation in her chest and reports the symptoms felt similar to when her reflux was exacerbated while pregnant. Patient also has been fighting an upper respiratory infection over the past few weeks with a persistent cough.  Patient is nontoxic/nonseptic appearing as well as afebrile. She is in no distress and has normal lung sounds bilaterally. No tachypnea or dyspnea. Patient also has no hypoxia. Doubt  pulmonary embolus as cause of symptoms today, especially in the setting of recent URI and persistent cough. Cardiac workup is negative. Low suspicion for cardiac etiology given reassuring work up, age, and lack of risk factors. CXR shows no PTX or PNA. She ambulates in the ED without tachycardia or hypoxia.  Patient to be managed for acute bronchitis with prednisone as well as an albuterol inhaler. Will also add Pepcid as patient reports a history of reflux and that the "burning" nature of her pain felt similar to this. Reflux may be the cause of any underlying cough rather than a bronchitis, though this is something that can be followed by a primary care doctor. Patient advised to follow-up with a primary care physician for a recheck of her symptoms. Return precautions discussed and provided. Patient agreeable to plan with no unaddressed concerns.   Filed Vitals:   04/05/14 0151 04/05/14 0248  BP: 118/64 144/67  Pulse: 87 80  Temp: 98 F (36.7 C) 98.1 F (36.7 C)  TempSrc: Oral Oral  Resp: 17 20  Height:  (1.753 m)   Weight: 279 lb (126.554 kg)   SpO2: 98% 99%  Antony Madura, PA-C 04/05/14 1610  Devoria Albe, MD 04/05/14 0630

## 2015-01-20 ENCOUNTER — Encounter (HOSPITAL_COMMUNITY): Payer: Self-pay | Admitting: *Deleted

## 2015-01-20 ENCOUNTER — Emergency Department (HOSPITAL_COMMUNITY)
Admission: EM | Admit: 2015-01-20 | Discharge: 2015-01-20 | Disposition: A | Discharge status: Home / Self Care | Payer: Self-pay | Attending: Emergency Medicine | Admitting: Emergency Medicine

## 2015-01-20 DIAGNOSIS — J029 Acute pharyngitis, unspecified: Secondary | ICD-10-CM | POA: Insufficient documentation

## 2015-01-20 DIAGNOSIS — Z7952 Long term (current) use of systemic steroids: Secondary | ICD-10-CM | POA: Insufficient documentation

## 2015-01-20 DIAGNOSIS — Z79899 Other long term (current) drug therapy: Secondary | ICD-10-CM | POA: Insufficient documentation

## 2015-01-20 LAB — RAPID STREP SCREEN (MED CTR MEBANE ONLY): STREPTOCOCCUS, GROUP A SCREEN (DIRECT): NEGATIVE

## 2015-01-20 MED ORDER — IBUPROFEN 800 MG PO TABS: 800.0000 mg | ORAL_TABLET | Freq: Three times a day (TID) | ORAL | Status: AC

## 2015-01-20 MED ORDER — IBUPROFEN 400 MG PO TABS
800.0000 mg | ORAL_TABLET | Freq: Once | ORAL | Status: AC
  Administered 2015-01-20: 800 mg via ORAL
  Filled 2015-01-20: qty 2

## 2015-01-20 NOTE — ED Provider Notes (Signed)
CSN: [id number]     Arrival date & time 01/20/15  1027 History  By signing my name below, I, Debra Becker, attest that this documentation has been prepared under the direction and in the presence of Theone Bowell C. Shavanna Furnari, PA-C. Electronically Signed: Placido Becker, ED Scribe. 01/20/2015. 11:15 AM.   Chief Complaint  Patient presents with  . Sore Throat   The history is provided by the patient. No language interpreter was used.    HPI Comments: Debra Becker is a 19 y.o. female who presents to the Emergency Department complaining of constant, 6/10, scratchy, non-radiating, sore throat with onset 1 week ago. She notes associated sinus congestion, post nasal drip and productive cough. Patient denies fever, chills, nausea/vomiting, shortness of breath, chest pain, difficulty swallowing, or any other pain or complaints. Patient has not taken anything to help with pain or congestion.  History reviewed. No pertinent past medical history. Past Surgical History  Procedure Laterality Date  . Mouth surgery     History reviewed. No pertinent family history. Social History  Substance Use Topics  . Smoking status: Never Smoker   . Smokeless tobacco: None  . Alcohol Use: Yes   OB History    No data available     Review of Systems  Constitutional: Negative for fever and chills.  HENT: Positive for congestion, postnasal drip, rhinorrhea, sinus pressure and sore throat. Negative for trouble swallowing and voice change.   Respiratory: Positive for cough. Negative for shortness of breath.   Cardiovascular: Negative for chest pain.  Gastrointestinal: Negative for nausea and vomiting.  All other systems reviewed and are negative.  Allergies  Review of patient's allergies indicates no known allergies.  Home Medications   Prior to Admission medications   Medication Sig Start Date End Date Taking? Authorizing Provider  etonogestrel (IMPLANON) 68 MG IMPL implant 1 each by Subdermal route once. 2015     Historical Provider, MD  famotidine (PEPCID) 20 MG tablet Take 1 tablet (20 mg total) by mouth 2 (two) times daily. 04/05/14   Antony Madura, PA-C  HYDROcodone-acetaminophen (NORCO/VICODIN) 5-325 MG per tablet Take 2 tablets by mouth every 6 (six) hours as needed for moderate pain or severe pain. 03/13/14   Roxy Horseman, PA-C  ibuprofen (ADVIL,MOTRIN) 800 MG tablet Take 1 tablet (800 mg total) by mouth 3 (three) times daily. 01/20/15   Gerad Cornelio C Ceniya Fowers, PA-C  metroNIDAZOLE (FLAGYL) 500 MG tablet Take 1 tablet (500 mg total) by mouth 2 (two) times daily. Patient not taking: Reported on 12/16/2013 11/30/13   Rodolph Bong, MD  predniSONE (DELTASONE) 20 MG tablet Take 2 tablets (40 mg total) by mouth daily. 04/05/14   Antony Madura, PA-C   BP 141/70 mmHg  Pulse 95  Temp(Src) 98.3 F (36.8 C) (Oral)  Resp 20  Ht  (1.753 m)  Wt 127.007 kg  BMI 41.33 kg/m2  SpO2 100% Physical Exam  Constitutional: She appears well-developed and well-nourished.  HENT:  Head: Normocephalic and atraumatic.  Mouth/Throat: Uvula is midline. Posterior oropharyngeal erythema present. No oropharyngeal exudate, posterior oropharyngeal edema or tonsillar abscesses.  No hot potato voice; bilateral erythema in posterior pharynx without exudate; no trismus. No evidence of abscess.  Neck: Normal range of motion. Neck supple.  Cardiovascular: Normal rate, regular rhythm and normal heart sounds.   Pulmonary/Chest: Effort normal and breath sounds normal. No respiratory distress. She has no wheezes. She has no rales.  Abdominal: Soft. There is no tenderness.  Lymphadenopathy:    She has  no cervical adenopathy.  Neurological: She is alert.  Skin: Skin is warm and dry. She is not diaphoretic.  Psychiatric: She has a normal mood and affect. Her behavior is normal.  Nursing note and vitals reviewed.  ED Course  Procedures  DIAGNOSTIC STUDIES: Oxygen Saturation is 100% on RA, normal by my interpretation.    COORDINATION OF  CARE: 11:12 AM Pt presents today with multiple cold like symptoms. Discussed next steps including a rapid strep screening and reevaluation based on lab results. Pt agreed to the plan.   Labs Review Labs Reviewed  RAPID STREP SCREEN (NOT AT [facility])  CULTURE, GROUP A STREP    Imaging Review No results found. I have personally reviewed and evaluated these lab results as part of my medical decision-making.   EKG Interpretation None      MDM   Final diagnoses:  Pharyngitis    [name] presents with sore throat, congestion, cough, and sinus pressure for the last week.  Patient's presentation is consistent with a viral pharyngitis and viral URI versus bronchitis. Patient is nontoxic appearing, not tachycardic, is afebrile, not take it neck, maintains SPO2 100% on room air, and is in no apparent distress. Patient's strep screening was negative, which gives evidence to this being a viral sickening. Patient was given instructions for supportive care as well as return precautions. Patient voiced understanding of these instructions, accepts the plan, and is comfortable with discharge.  I personally performed the services described in this documentation, which was scribed in my presence. The recorded information has been reviewed and is accurate.    Anselm PancoastShawn C Kimba Lottes, PA-C 01/20/15 1827  Doug SouSam Jacubowitz, MD 01/21/15 1807

## 2015-01-20 NOTE — Discharge Instructions (Signed)
You have been seen today for a sore throat. The rapid strep test was negative for strep. This means that your symptoms are likely due to a viral infection. Viral infections do not require antibiotics and treatment is supportive care. Follow up with PCP as needed. Return to ED should symptoms worsen. Drink plenty of fluids and get plenty of rest. Warm liquids or Chloraseptic spray may help with sore throat relief. May also take ibuprofen for pain.   Emergency Department Resource Guide 1) Find a Doctor and Pay Out of Pocket Although you won't have to find out who is covered by your insurance plan, it is a good idea to ask around and get recommendations. You will then need to call the office and see if the doctor you have chosen will accept you as a new patient and what types of options they offer for patients who are self-pay. Some doctors offer discounts or will set up payment plans for their patients who do not have insurance, but you will need to ask so you aren't surprised when you get to your appointment.  2) Contact Your Local Health Department Not all health departments have doctors that can see patients for sick visits, but many do, so it is worth a call to see if yours does. If you don't know where your local health department is, you can check in your phone book. The CDC also has a tool to help you locate your state's health department, and many state websites also have listings of all of their local health departments.  3) Find a Walk-in Clinic If your illness is not likely to be very severe or complicated, you may want to try a walk in clinic. These are popping up all over the country in pharmacies, drugstores, and shopping centers. They're usually staffed by nurse practitioners or physician assistants that have been trained to treat common illnesses and complaints. They're usually fairly quick and inexpensive. However, if you have serious medical issues or chronic medical problems, these are  probably not your best option.  No Primary Care Doctor: - Call Health Connect at  [phone] - they can help you locate a primary care doctor that  accepts your insurance, provides certain services, etc. - Physician Referral Service- [phone]  Chronic Pain Problems: Organization         Address  Phone   Notes  [facility]  [phone] Patients need to be referred by their primary care doctor.   Medication Assistance: Organization         Address  Phone   Notes  [facility]ication [facility] [address] [place], [place] [address] [phone] --Must be a resident of [place] -- Must have NO insurance coverage whatsoever (no Medicaid/ Medicare, etc.) -- The pt. MUST have a primary care doctor that directs their care regularly and follows them in the community   MedAssist  [phone]) [phone]   [employer]  [phone]) [phone]    Agencies that provide inexpensive medical care: Organization         Address  Phone   Notes  [facility]  [phone]) [phone]   [facility]    [phone]) [phone]   [facility] [address] [place], [place] [address] [phone]) [phone]   [facility] [address], [place] [phone]   Planned Parenthood    [phone]   [facility]    [phone]   [address], [place] Phone:  [phone], Fax:  [phone] Hours of Operation:  9 am - 6 pm, M-F.  Also accepts Medicaid/Medicare and self-pay.  [facility] for [address] [place], [address], [place] Phone: [phone], Fax: [phone]. Hours of Operation:  8:30 am - 5:30 pm, M-F.  Also accepts Medicaid and self-pay.  [facility] [address] [place] Phone: [phone]   [place], [place], [place] [phone], Ext. [phone] Mondays & Thursdays:  7-9 AM.  First 15 patients are seen on a first come, first serve basis.    [place] Providers:  Organization         Address  Phone   Notes  [facility] [address], [place] [phone] Also accepts self-pay patients.  [facility] [id number] [place], [place]  [phone]   [address], [place] [phone]   [facility] [address], [place] [phone]   Lucianne Lei [address], [place]   [phone] Only accepts [place] Access [place] patients after they have their name applied to their card.   Self-Pay (no insurance) in [facility]:  Organization         Address  Phone   Notes  Sickle Cell Patients, [facility] Internal Medicine [place] [phone]   [facility] [place] [phone]   [facility] [place]  [place], [place], [place] [phone]   [facility]/Dr. Osei-Bonsu  [address], [place] or [address], [place] [phone] Phone number for both [place] and [place] locations is the same.  Urgent Medical and [facility] [address] [place] [phone]   [facility] [address], [place] or [address] [phone]   [facility] [address] [place] [phone], phone; [phone], fax Sees patients 1st and 3rd Saturday of every month.  Must not qualify for public or private insurance (i.e. Medicaid, Medicare, [place] Health Choice, Veterans' Benefits)  Household income should be no more than 200% of the poverty level The clinic cannot treat you if you are pregnant or think you are pregnant  Sexually transmitted diseases are not treated at the clinic.     Dental Care: Organization         Address  Phone  Notes  [facility] Department of [facility] [place] [phone] Accepts children up to age 40 who are enrolled in [place] or [place]; pregnant women with a Medicaid card; and children who have applied for Medicaid or [place] Health Choice, but were declined, whose parents can pay a reduced fee at time of service.  [facility] Department of [facility]  [address], [place] [phone] Accepts children up to age 98 who are enrolled in [place] or [place]; pregnant women with a Medicaid card; and children who have applied for Medicaid or [place] Health Choice, but were declined, whose parents can pay a reduced fee at time of service.  [place]  Access PROGRAM  [place] [phone] Patients are seen by appointment only. Walk-ins are not accepted. [place] will see patients 28 years of age and older. Monday - Tuesday (8am-5pm) Most Wednesdays (8:30-5pm) $30 per visit, cash only  [facility] Access PROGRAM  [address], [facility] [phone] Patients are seen by appointment only. Walk-ins are not accepted. [place] will see patients 93 years of age and older. One Wednesday Evening (Monthly: Volunteer Based).  $30 per visit, cash only  [place]  [phone] for adults; Children under age 22, call Graduate Pediatric Dentistry at [phone]. Children aged 36-14, please call [phone] to request a pediatric application.  Dental services are provided in all areas of dental care including fillings, crowns and bridges, complete and partial dentures, implants, gum treatment, root canals, and extractions. Preventive care is also provided. Treatment is provided to both adults and children. Patients are selected via a lottery and there is often a waiting  list.   [facility] [address], [place]  [phone] [email or web address]   [facility] [address] [place], [place] [phone], Ext. [phone] Second and Fourth Thursday of each month, opens at 6:30 AM; Clinic ends at 9 AM.  Patients are seen on a first-come first-served basis, and a limited number are seen during each clinic.   [facility]  [address] Hillard Danker [place], [place] [phone]   Eligibility Requirements You must have lived in [place], [place], or [place] counties for at least the last three months.   You cannot be eligible for state or federal sponsored [employer], including [employer], [place], or [employer].   You generally cannot be eligible for healthcare insurance through your employer.    How to apply: Eligibility screenings are held every Tuesday and Wednesday afternoon from 1:00 pm until 4:00 pm. You do not need an appointment for the interview!  [facility] [address], [place], [place]   [place]  [place] Department  [place]  [phone]    Behavioral Health Resources in the Community: Intensive Outpatient Programs Organization         Address  Phone  Notes  [place]. [address] [place], [place] [phone]   [facility] Outpatient [address], [place], [place]   ADS: Alcohol & Drug Svcs [address], [place], [place]   [place] [address] [place] [address]  [place], [place] or [phone]   Substance Abuse Resources Organization         Address  Phone  Notes  Alcohol and Drug Services  [phone]   [place]  [phone]   The [place]   Chinita Pester  [phone]   Residential & Outpatient Substance Abuse Program   [phone]   Psychological Services Organization         Address  Phone  Notes  [facility]  [place]  [phone]   [place] [address] [place] or [phone]    Mobile Crisis Teams Organization         Address  Phone  Notes  [facility], Mobile Crisis Care Unit  [phone]   [facility]  [address] [place],  [place] [address] [place] [phone]    Self-Help/Support Groups Organization         Address  Phone             Notes  [facility] of [place] - variety of support groups  [place] Call for more information  Narcotics Anonymous (NA), [facility] [address], [employer] [place]  2 meetings at this location   Special educational needs teacher         Address  Phone  Notes  ASAP Residential Treatment [place],    [place]  [phone]   [facility]  [address] [place] [id number], [place], [place]   [place], [place] [phone] Admissions: 8am-3pm M-F  Incentives Substance [place] [address],    [place], [place] [phone]   [facility] [address] [place], [place], [place]   [facility] [address],  [place], [place]   [facility] Programs - Intensive Outpatient [address] Kristeen [address], [place], [place]   [facility] ([place] [address].,  [place], [place] [phone] or [phone]   [facility] Services (RTS) [address], [place], [place] Accepts Medicaid  [facility] [place] [address],  [place] [phone] Substance Abuse/Addiction Treatment   [facility] Organization          Address  Phone  Notes  [facility]  [phone]   Domenic Schwab, PhD [address] Arlis Porta [place], [place]   [phone] or [phone]   [place], [place] [phone]   [facility] [address], [place], [place] [phone] Insurance/Medicaid/sponsorship through [facility] and Families [address]                                    [place], [place] [phone] [place] [address] [place], [place] [phone]    Dr. Adele Schilder  [phone]   [facility] Dept. 1) [address], [place] 2) [place] 3)  [place] [address], [place] [phone]  [phone]   [place] [phone] or [phone] (After Hours)

## 2015-01-20 NOTE — ED Notes (Signed)
Pty reports throat pain started on Monday. Pt also reports a cough.

## 2015-01-20 NOTE — ED Notes (Signed)
Declined W/C at D/C and was escorted to lobby by RN. 

## 2015-01-23 LAB — CULTURE, GROUP A STREP

## 2015-09-02 ENCOUNTER — Emergency Department (HOSPITAL_COMMUNITY)
Admission: EM | Admit: 2015-09-02 | Discharge: 2015-09-02 | Disposition: A | Discharge status: Home / Self Care | Payer: Self-pay | Attending: Emergency Medicine | Admitting: Emergency Medicine

## 2015-09-02 ENCOUNTER — Encounter (HOSPITAL_COMMUNITY): Payer: Self-pay | Admitting: Emergency Medicine

## 2015-09-02 DIAGNOSIS — N760 Acute vaginitis: Secondary | ICD-10-CM | POA: Insufficient documentation

## 2015-09-02 DIAGNOSIS — B9689 Other specified bacterial agents as the cause of diseases classified elsewhere: Secondary | ICD-10-CM | POA: Insufficient documentation

## 2015-09-02 LAB — URINALYSIS, ROUTINE W REFLEX MICROSCOPIC
Bilirubin Urine: NEGATIVE
Glucose, UA: NEGATIVE mg/dL
HGB URINE DIPSTICK: NEGATIVE
Ketones, ur: NEGATIVE mg/dL
Leukocytes, UA: NEGATIVE
Nitrite: NEGATIVE
PH: 6 (ref 5.0–8.0)
Protein, ur: NEGATIVE mg/dL
SPECIFIC GRAVITY, URINE: 1.021 (ref 1.005–1.030)

## 2015-09-02 LAB — WET PREP, GENITAL
Sperm: NONE SEEN
Trich, Wet Prep: NONE SEEN
Yeast Wet Prep HPF POC: NONE SEEN

## 2015-09-02 LAB — PREGNANCY, URINE: Preg Test, Ur: NEGATIVE

## 2015-09-02 MED ORDER — METRONIDAZOLE 500 MG PO TABS: 500.0000 mg | ORAL_TABLET | Freq: Two times a day (BID) | ORAL | 0 refills | Status: DC

## 2015-09-02 NOTE — Discharge Instructions (Signed)
Please take all of your antibiotics until finished!  Call the women's clinic to schedule a follow up appointment if symptoms do not improve in 3 days.  Please return to the ER for worsening symptoms, high fevers or persistent vomiting.  You have been tested for chlamydia and gonorrhea. These results will be available in approximately 3 days. You will be notified if they are positive.   SEEK IMMEDIATE MEDICAL CARE IF:  You develop an oral temperature above 102 F (38.9 C), not controlled by medications or lasting more than 2 days.  You develop an increase in pain.  You develop any type of abnormal discharge.  You develop vaginal bleeding and it is not time for your period.  You develop painful intercourse.

## 2015-09-02 NOTE — ED Triage Notes (Addendum)
Pt reports yesterday began experiencing painful and frequent urination. No other complaints. LMP 7/20. Denies fevers or pain but reports had some flank pain yesterday.

## 2015-09-02 NOTE — ED Provider Notes (Signed)
[facility]EM[facility] DEPT Provider Note   CSN: [id number] Arrival date & time: 09/02/15  4098  First Provider Contact:   First MD Initiated Contact with Patient 09/02/15 [phone]        History   Chief Complaint Chief Complaint  Patient presents with  . Urinary Tract Infection    HPI Debra Becker is a 20 y.o. female.  Debra Becker is an otherwise healthy 20 y.o. female who presents to the Emergency Department complaining of dysuria x 2 days. Patient also endorses urinary frequency and hesitancy. She states that 3-4 days ago, she was experiencing vaginal itching similar to a yeast infection in the past, however this has now resolved. Denies vaginal discharge, abdominal pain, back pain, n/v/d, fever. No hx of similar sxs or UTI's in the past. LMP 7/20.    The history is provided by the patient and medical records. No language interpreter was used.  Urinary Tract Infection   Associated symptoms include frequency and urgency. Pertinent negatives include no chills, no nausea and no vomiting.    History reviewed. No pertinent past medical history.  There are no active problems to display for this patient.   Past Surgical History:  Procedure Laterality Date  . MOUTH SURGERY      OB History    No data available       Home Medications    Prior to Admission medications   Medication Sig Start Date End Date Taking? Authorizing Provider  acetaminophen (TYLENOL) 325 MG tablet Take 650 mg by mouth every 6 (six) hours as needed for mild pain.   Yes Historical Provider, MD  PRESCRIPTION MEDICATION Take 1 tablet by mouth daily. "Birth Control"   Yes Historical Provider, MD  famotidine (PEPCID) 20 MG tablet Take 1 tablet (20 mg total) by mouth 2 (two) times daily. Patient not taking: Reported on 09/02/2015 04/05/14   Antony Madura, PA-C  HYDROcodone-acetaminophen (NORCO/VICODIN) 5-325 MG per tablet Take 2 tablets by mouth every 6 (six) hours as needed for moderate pain or severe  pain. Patient not taking: Reported on 09/02/2015 03/13/14   Roxy Horseman, PA-C  ibuprofen (ADVIL,MOTRIN) 800 MG tablet Take 1 tablet (800 mg total) by mouth 3 (three) times daily. Patient not taking: Reported on 09/02/2015 01/20/15   Shawn C Joy, PA-C  metroNIDAZOLE (FLAGYL) 500 MG tablet Take 1 tablet (500 mg total) by mouth 2 (two) times daily. 09/02/15   Chase Picket Cyanne Delmar, PA-C  predniSONE (DELTASONE) 20 MG tablet Take 2 tablets (40 mg total) by mouth daily. Patient not taking: Reported on 09/02/2015 04/05/14   Antony Madura, PA-C    Family History No family history on file.  Social History Social History  Substance Use Topics  . Smoking status: Never Smoker  . Smokeless tobacco: Not on file  . Alcohol use Yes     Allergies   Chocolate   Review of Systems Review of Systems  Constitutional: Negative for chills and fever.  HENT: Negative for congestion.   Eyes: Negative for visual disturbance.  Respiratory: Negative for cough and shortness of breath.   Cardiovascular: Negative.   Gastrointestinal: Negative for abdominal pain, diarrhea, nausea and vomiting.  Genitourinary: Positive for dysuria, frequency and urgency. Negative for vaginal bleeding and vaginal discharge.  Musculoskeletal: Negative for back pain and neck pain.  Skin: Negative for rash.  Neurological: Negative for headaches.     Physical Exam Updated Vital Signs BP 130/78   Pulse 74   Temp 98.4 F (36.9 C)   Resp  18   Ht 5\' 9"  (1.753 m)   Wt 127 kg   LMP 08/16/2015   SpO2 99%   BMI 41.35 kg/m   Physical Exam  Constitutional: She is oriented to person, place, and time. She appears well-developed and well-nourished. No distress.  Non-toxic appearing.   HENT:  Head: Normocephalic and atraumatic.  Cardiovascular: Normal rate, regular rhythm and normal heart sounds.   No murmur heard. Pulmonary/Chest: Effort normal and breath sounds normal. No respiratory distress.  Abdominal: Soft. Bowel sounds are  normal. She exhibits no distension.  No abdominal or CVA tenderness.  Genitourinary:  Genitourinary Comments: Chaperone present for exam. + white discharge.  No rashes, lesions, or tenderness to external genitalia. No erythema, injury, or tenderness to vaginal mucosa. No bleeding within vaginal vault. No adnexal masses, tenderness, or fullness. No CMT.  Neurological: She is alert and oriented to person, place, and time.  Skin: Skin is warm and dry.  Nursing note and vitals reviewed.    ED Treatments / Results  Labs (all labs ordered are listed, but only abnormal results are displayed) Labs Reviewed  WET PREP, GENITAL - Abnormal; Notable for the following:       Result Value   Clue Cells Wet Prep HPF POC PRESENT (*)    WBC, Wet Prep HPF POC PRESENT (*)    All other components within normal limits  URINALYSIS, ROUTINE W REFLEX MICROSCOPIC (NOT AT [facility])  PREGNANCY, URINE  GC/CHLAMYDIA PROBE AMP ([place]) NOT AT [facility]    EKG  EKG Interpretation None       Radiology No results found.  Procedures Procedures (including critical care time)  Medications Ordered in ED Medications - No data to display   Initial Impression / Assessment and Plan / ED Course  I have reviewed the triage vital signs and the nursing notes.  Pertinent labs & imaging results that were available during my care of the patient were reviewed by me and considered in my medical decision making (see chart for details).  Clinical Course   Debra Becker is a 20 y.o. female who is otherwise healthy and very well appearing who presents to the ED for dysuria, hesitancy, and urgency x 2 days. On exam, patient is afebrile and hemodynamically stable with a non-surgical abdomen. She exhibits no abdominal or CVA tenderness. UA was obtained and unremarkable. Offered pelvic examination for further eval of today's symptoms which she would like. On exam, patient with vaginal discharge. Wet prep reveals clue cells and  WBC's. Will treat with Flagyl. No CMT. G&C obtained and patient informed she will be called if results are positive. No OBGYN - Women's outpatient clinic follow-up if symptoms do not improve. Return precautions discussed and all questions answered.  Patient discussed with Dr. Eudelia Bunchardama who agrees with treatment plan.   Final Clinical Impressions(s) / ED Diagnoses   Final diagnoses:  BV (bacterial vaginosis)    New Prescriptions New Prescriptions   METRONIDAZOLE (FLAGYL) 500 MG TABLET    Take 1 tablet (500 mg total) by mouth 2 (two) times daily.     [facility] Care[facility]ime Pilcher Johncarlo Maalouf, PA-C 09/02/15 1144    Nira ConnPedro Eduardo Cardama, MD 09/02/15 2056

## 2015-09-03 LAB — GC/CHLAMYDIA PROBE AMP (~~LOC~~) NOT AT ARMC
CHLAMYDIA, DNA PROBE: POSITIVE — AB
NEISSERIA GONORRHEA: NEGATIVE

## 2015-09-04 ENCOUNTER — Telehealth (HOSPITAL_BASED_OUTPATIENT_CLINIC_OR_DEPARTMENT_OTHER): Payer: Self-pay | Admitting: Emergency Medicine

## 2015-09-04 NOTE — Telephone Encounter (Signed)
Chart handoff to EDP for treatment plan for + Chlamydia 

## 2015-09-05 ENCOUNTER — Telehealth (HOSPITAL_BASED_OUTPATIENT_CLINIC_OR_DEPARTMENT_OTHER): Payer: Self-pay | Admitting: Emergency Medicine

## 2015-09-05 ENCOUNTER — Telehealth (HOSPITAL_BASED_OUTPATIENT_CLINIC_OR_DEPARTMENT_OTHER): Payer: Self-pay | Admitting: *Deleted

## 2015-09-05 NOTE — Telephone Encounter (Signed)
Post ED Visit - Positive Culture Follow-up: Successful Patient Follow-Up  Culture assessed and recommendations reviewed by: []  Enzo BiNathan Batchelder, Pharm.D. []  Celedonio MiyamotoJeremy Frens, Pharm.D., BCPS []  Garvin FilaMike Maccia, Pharm.D. []  Georgina PillionElizabeth Martin, Pharm.D., BCPS []  [place]h Pham, [address] BoulevardPharm.D., BCPS, AAHIVP []  Estella HuskMichelle Turner, Pharm.D., BCPS, AAHIVP []  Tennis Mustassie Stewart, Pharm.D. []  Sherle Poeob Vincent, [address] BoulevardPharm.D.  Positive chlamydia  culture  []  Patient discharged without antimicrobial prescription and treatment is now indicated []  Organism is resistant to prescribed ED discharge antimicrobial []  Patient with positive blood cultures  Changes discussed with ED provider: Monika Salkaniel KnottMD New antibiotic prescription azithromycin 1000mg . X 1 dose [facility] [address]  Contacted patient, date 09/05/2015, time 4:42   Britiny Defrain, Dixon BoosMaria Samson 09/05/2015, 4:42 PM

## 2016-01-20 ENCOUNTER — Emergency Department (HOSPITAL_COMMUNITY)
Admission: EM | Admit: 2016-01-20 | Discharge: 2016-01-20 | Disposition: A | Discharge status: Home / Self Care | Payer: No Typology Code available for payment source | Attending: Emergency Medicine | Admitting: Emergency Medicine

## 2016-01-20 ENCOUNTER — Encounter (HOSPITAL_COMMUNITY): Payer: Self-pay

## 2016-01-20 DIAGNOSIS — Z202 Contact with and (suspected) exposure to infections with a predominantly sexual mode of transmission: Secondary | ICD-10-CM | POA: Insufficient documentation

## 2016-01-20 DIAGNOSIS — Z711 Person with feared health complaint in whom no diagnosis is made: Secondary | ICD-10-CM

## 2016-01-20 LAB — POC URINE PREG, ED: Preg Test, Ur: NEGATIVE

## 2016-01-20 NOTE — ED Triage Notes (Signed)
Patient has no complaints. States that she was informed that she has been been informed to chlamydia.  No discharge, no dysuria

## 2016-01-20 NOTE — ED Provider Notes (Signed)
[facility] Provider Note    By signing my name below, I, Earmon PhoenixJennifer Waddell, attest that this documentation has been prepared under the direction and in the presence of Melburn HakeNicole Nadeau, PA-C. Electronically Signed: Earmon PhoenixJennifer Waddell, ED Scribe. 01/20/16. 3:51 PM.    History   Chief Complaint No chief complaint on file.  HPI  HPI Comments:  Debra Becker is an obese 20 y.o. female who presents to the Emergency Department complaining of a possible exposure to an STD. She states she had a full STD panel done approximately three weeks ago and received negative results. She states her partner's child's mother contacted her today and said she tested positive for chlamydia so she should be checked as well. She has not done anything as treatment because she has been asymptomatic. There are no modifying factors noted. She denies abdominal pain, nausea, vomiting , fever, chills, vaginal discharge or bleeding, genital lesions, rashes, dysuria, hematuria. She denies being sexually active with anyone other than her one partner. LMP last week. Pt reports she has continued to only be sexually active with the same partner over the past month but denies using condoms. Pt reports he partner has also denied any sxs.    History reviewed. No pertinent past medical history.  There are no active problems to display for this patient.   Past Surgical History:  Procedure Laterality Date  . MOUTH SURGERY      OB History    No data available       Home Medications    Prior to Admission medications   Medication Sig Start Date End Date Taking? Authorizing Provider  acetaminophen (TYLENOL) 325 MG tablet Take 650 mg by mouth every 6 (six) hours as needed for mild pain.    Historical Provider, MD  famotidine (PEPCID) 20 MG tablet Take 1 tablet (20 mg total) by mouth 2 (two) times daily. Patient not taking: Reported on 09/02/2015 04/05/14   Antony MaduraKelly Humes, PA-C  HYDROcodone-acetaminophen (NORCO/VICODIN)  5-325 MG per tablet Take 2 tablets by mouth every 6 (six) hours as needed for moderate pain or severe pain. Patient not taking: Reported on 09/02/2015 03/13/14   Roxy Horsemanobert Browning, PA-C  ibuprofen (ADVIL,MOTRIN) 800 MG tablet Take 1 tablet (800 mg total) by mouth 3 (three) times daily. Patient not taking: Reported on 09/02/2015 01/20/15   Shawn C Joy, PA-C  metroNIDAZOLE (FLAGYL) 500 MG tablet Take 1 tablet (500 mg total) by mouth 2 (two) times daily. 09/02/15   Chase PicketJaime Pilcher Ward, PA-C  predniSONE (DELTASONE) 20 MG tablet Take 2 tablets (40 mg total) by mouth daily. Patient not taking: Reported on 09/02/2015 04/05/14   Antony MaduraKelly Humes, PA-C  PRESCRIPTION MEDICATION Take 1 tablet by mouth daily. "Birth Control"    Historical Provider, MD    Family History No family history on file.  Social History Social History  Substance Use Topics  . Smoking status: Never Smoker  . Smokeless tobacco: Not on file  . Alcohol use Yes     Allergies   Chocolate   Review of Systems Review of Systems  Constitutional: Negative for chills and fever.  Gastrointestinal: Negative for abdominal pain, nausea and vomiting.  Genitourinary: Negative for dysuria, genital sores, vaginal bleeding and vaginal discharge.       Possible STD exposure     Physical Exam Updated Vital Signs BP 144/80   Pulse 100   Temp 98.3 F (36.8 C) (Oral)   Resp 18   SpO2 100%   Physical Exam  Constitutional: She  is oriented to person, place, and time. She appears well-developed and well-nourished.  HENT:  Head: Normocephalic and atraumatic.  Mouth/Throat: No oral lesions.  Eyes: Conjunctivae and EOM are normal. Right eye exhibits no discharge. Left eye exhibits no discharge. No scleral icterus.  Neck: Normal range of motion. Neck supple.  Cardiovascular: Normal rate, regular rhythm, normal heart sounds and intact distal pulses.   Pulmonary/Chest: Effort normal and breath sounds normal. No respiratory distress. She has no wheezes. She  has no rales. She exhibits no tenderness.  Abdominal: Soft. Bowel sounds are normal. She exhibits no distension and no mass. There is no tenderness. There is no rebound and no guarding.  Musculoskeletal: Normal range of motion. She exhibits no edema.  Neurological: She is alert and oriented to person, place, and time.  Skin: Skin is warm and dry.  Nursing note and vitals reviewed.    ED Treatments / Results  DIAGNOSTIC STUDIES: Oxygen Saturation is 100% on RA, normal by my interpretation.   COORDINATION OF CARE: 3:31 PM- Will order STD panel for RPR, HIV, GC/chlamydia. Pt verbalizes understanding and agrees to plan.  3:48 PM- Pt refuses the RPR and HIV testing stating that unless the GC/chlamydia comes back positive. Explained to pt that if she is at risk for one, she is at risk for all and she still declines blood draw.   Medications - No data to display  Labs (all labs ordered are listed, but only abnormal results are displayed) Labs Reviewed  GC/CHLAMYDIA PROBE AMP ([place]) NOT AT [facility]    EKG  EKG Interpretation None       Radiology No results found.  Procedures Procedures (including critical care time)  Medications Ordered in ED Medications - No data to display   Initial Impression / Assessment and Plan / ED Course  I have reviewed the triage vital signs and the nursing notes.  Pertinent labs & imaging results that were available during my care of the patient were reviewed by me and considered in my medical decision making (see chart for details).  Clinical Course     Pt arrives for asymptomatic STD check. Pt refused blood draw for HIV and RPR testing. I discussed with pt indication for labs due to pt reports possible exposure and recent unprotected sex however pt continued to refuse testing. Discussed safe sexual practices. Pt is advised to follow up for free testing at local health department in the future. Pt appears safe for discharge.   I  personally performed the services described in this documentation, which was scribed in my presence. The recorded information has been reviewed and is accurate.   Final Clinical Impressions(s) / ED Diagnoses   Final diagnoses:  Concern about STD in female without diagnosis    New Prescriptions New Prescriptions   No medications on file     Barrett Henleicole Elizabeth Nadeau, [place] JerseyPA-C 01/20/16 1604    Lyndal Pulleyaniel Knott, MD 01/21/16 [phone]

## 2016-01-20 NOTE — ED Triage Notes (Addendum)
PT reports she tested neg for chlamydia on 01-01-16 . Pt was contacted in past few days that the same partner has given chlamydia to another partner. Pt has had unprotected sex  Since 01-01-16 and wants to be checked out. PT denies any physical Sx's.

## 2016-01-20 NOTE — Discharge Instructions (Signed)
1. Medications: usual home medications °2. Treatment: rest, drink plenty of fluids, use a condom with every sexual encounter °3. Follow Up: Please followup with your primary doctor in 3 days for discussion of your diagnoses and further evaluation after today's visit; if you do not have a primary care doctor use the resource guide provided to find one; Please return to the ER for worsening symptoms, high fevers or persistent vomiting. ° °You have been tested for chlamydia and gonorrhea.  These results will be available in approximately 3 days.  Please inform all sexual partners if you test positive for any of these diseases.  ° °[facility] [place] [facility] tests for every STD. There are 9 other clinics in [place], but only 3 of them offer such comprehensive testing. ° °Services Offered: °Testing Services  °Chlamydia Testing °Conventional Blood HIV Testing °Gonorrhea Testing °Hepatitis B Testing °Hepatitis C Testing °Herpes Testing °Syphilis Testing °TB Testing ° °Vaccines And Treatments:  °Hepatitis B Vaccine °Hepatitis A Vaccine °HPV Vaccine °TB Treatment °Gynecological Care °Family Planning ° °Prevention Services:  °HIV Test Counseling °HIV/AIDS Prevention Education °Safer Sex Education °Speakers Bureau °STD Prevention/Education °TB Prevention Education ° °Languages Spoken: °English °Spanish ° °Hours of Operation: °Note: Please contact the organization for hours of operation. Disclaimer: Hours of peration change frequently. Please contact the organization to verify. °Appointment Required: Yes ° °Contact Information: °Phone [phone] °Other Phones [phone] (Domestic Fax) ° °Address: °[address] °[place], [place] [address] ° °Website: [email or web address]  °

## 2016-01-22 LAB — GC/CHLAMYDIA PROBE AMP (~~LOC~~) NOT AT ARMC
Chlamydia: NEGATIVE
Neisseria Gonorrhea: NEGATIVE

## 2016-02-05 ENCOUNTER — Encounter (HOSPITAL_COMMUNITY): Payer: Self-pay

## 2016-02-05 ENCOUNTER — Emergency Department (HOSPITAL_COMMUNITY)
Admission: EM | Admit: 2016-02-05 | Discharge: 2016-02-06 | Disposition: A | Discharge status: Home / Self Care | Payer: No Typology Code available for payment source | Attending: Emergency Medicine | Admitting: Emergency Medicine

## 2016-02-05 DIAGNOSIS — Z3202 Encounter for pregnancy test, result negative: Secondary | ICD-10-CM | POA: Diagnosis not present

## 2016-02-05 DIAGNOSIS — R109 Unspecified abdominal pain: Secondary | ICD-10-CM | POA: Diagnosis not present

## 2016-02-05 DIAGNOSIS — N9489 Other specified conditions associated with female genital organs and menstrual cycle: Secondary | ICD-10-CM | POA: Diagnosis not present

## 2016-02-05 DIAGNOSIS — Z79899 Other long term (current) drug therapy: Secondary | ICD-10-CM | POA: Insufficient documentation

## 2016-02-05 LAB — URINALYSIS, ROUTINE W REFLEX MICROSCOPIC
BILIRUBIN URINE: NEGATIVE
Bacteria, UA: NONE SEEN
Glucose, UA: NEGATIVE mg/dL
HGB URINE DIPSTICK: NEGATIVE
Ketones, ur: 5 mg/dL — AB
LEUKOCYTES UA: NEGATIVE
NITRITE: NEGATIVE
PH: 5 (ref 5.0–8.0)
Protein, ur: 30 mg/dL — AB
SPECIFIC GRAVITY, URINE: 1.035 — AB (ref 1.005–1.030)

## 2016-02-05 LAB — CBC
HCT: 38.2 % (ref 36.0–46.0)
Hemoglobin: 12.6 g/dL (ref 12.0–15.0)
MCH: 27.5 pg (ref 26.0–34.0)
MCHC: 33 g/dL (ref 30.0–36.0)
MCV: 83.2 fL (ref 78.0–100.0)
PLATELETS: 246 10*3/uL (ref 150–400)
RBC: 4.59 MIL/uL (ref 3.87–5.11)
RDW: 13.3 % (ref 11.5–15.5)
WBC: 7.7 10*3/uL (ref 4.0–10.5)

## 2016-02-05 LAB — COMPREHENSIVE METABOLIC PANEL
ALK PHOS: 61 U/L (ref 38–126)
ALT: 22 U/L (ref 14–54)
AST: 19 U/L (ref 15–41)
Albumin: 3.7 g/dL (ref 3.5–5.0)
Anion gap: 7 (ref 5–15)
BILIRUBIN TOTAL: 0.6 mg/dL (ref 0.3–1.2)
BUN: 9 mg/dL (ref 6–20)
CALCIUM: 9.3 mg/dL (ref 8.9–10.3)
CO2: 26 mmol/L (ref 22–32)
CREATININE: 0.84 mg/dL (ref 0.44–1.00)
Chloride: 106 mmol/L (ref 101–111)
GFR calc Af Amer: >60 mL/min (ref >60)
Glucose, Bld: 99 mg/dL (ref 65–99)
Potassium: 3.9 mmol/L (ref 3.5–5.1)
Sodium: 139 mmol/L (ref 135–145)
TOTAL PROTEIN: 6.8 g/dL (ref 6.5–8.1)

## 2016-02-05 LAB — HCG, QUANTITATIVE, PREGNANCY: hCG, Beta Chain, Quant, S: <1 m[IU]/mL (ref <5)

## 2016-02-05 LAB — LIPASE, BLOOD: Lipase: 17 U/L (ref 11–51)

## 2016-02-05 NOTE — ED Provider Notes (Signed)
[facility] Provider Note   CSN: [id number] Arrival date & time: 02/05/16  1949  By signing my name below, I, Rosario Adie, attest that this documentation has been prepared under the direction and in the presence of Shon Baton, MD. Electronically Signed: Rosario Adie, ED Scribe. 02/05/16. 11:56 PM.  History   Chief Complaint Chief Complaint  Patient presents with  . Abdominal Pain   The history is provided by the patient. No language interpreter was used.    HPI Comments: Debra Becker is a 21 y.o. G63P1011 female with no pertinent PMHx, who presents to the Emergency Department requesting a pregnancy testing. Pt notes that she was due for her menstrual cycle four days ago. Her LMP was 01/07/16, and she is typically has regular menses at baseline. She has taken four pregnancy test at home, with only one faint positive. She notes that she has noticed mild,  lower left sided abdominal cramping beginning since she missed her menstrual cycle. Pt describes her pain as cramping and pt is not currently in pain while in the ED. She notes associated mild, nausea at night secondary to this as well. Pt was recently seen in the ED on 01/20/16, and at that time she was screened for STD. Her workup at that time was negative. No recent new sexual partners. She is not currently followed by a PCP or OBGYN. She denies vomiting, vaginal bleeding/discharge, or any other associated symptoms.   History reviewed. No pertinent past medical history.  There are no active problems to display for this patient.  Past Surgical History:  Procedure Laterality Date  . MOUTH SURGERY     OB History    No data available     Home Medications    Prior to Admission medications   Medication Sig Start Date End Date Taking? Authorizing Provider  acetaminophen (TYLENOL) 325 MG tablet Take 650 mg by mouth every 6 (six) hours as needed for mild pain.    Historical Provider, MD  famotidine  (PEPCID) 20 MG tablet Take 1 tablet (20 mg total) by mouth 2 (two) times daily. Patient not taking: Reported on 09/02/2015 04/05/14   Antony Madura, PA-C  HYDROcodone-acetaminophen (NORCO/VICODIN) 5-325 MG per tablet Take 2 tablets by mouth every 6 (six) hours as needed for moderate pain or severe pain. Patient not taking: Reported on 09/02/2015 03/13/14   Roxy Horseman, PA-C  ibuprofen (ADVIL,MOTRIN) 800 MG tablet Take 1 tablet (800 mg total) by mouth 3 (three) times daily. Patient not taking: Reported on 09/02/2015 01/20/15   Shawn C Joy, PA-C  metroNIDAZOLE (FLAGYL) 500 MG tablet Take 1 tablet (500 mg total) by mouth 2 (two) times daily. 09/02/15   Chase Picket Ward, PA-C  predniSONE (DELTASONE) 20 MG tablet Take 2 tablets (40 mg total) by mouth daily. Patient not taking: Reported on 09/02/2015 04/05/14   Antony Madura, PA-C  PRESCRIPTION MEDICATION Take 1 tablet by mouth daily. "Birth Control"    Historical Provider, MD    Family History History reviewed. No pertinent family history.  Social History Social History  Substance Use Topics  . Smoking status: Never Smoker  . Smokeless tobacco: Never Used  . Alcohol use Yes     Comment: occ    Allergies   Chocolate  Review of Systems Review of Systems  Constitutional: Negative for fever.  Respiratory: Negative for shortness of breath.   Cardiovascular: Negative for chest pain.  Gastrointestinal: Positive for abdominal pain and nausea. Negative for vomiting.  Genitourinary:  Negative for vaginal bleeding and vaginal discharge.  All other systems reviewed and are negative.  Physical Exam Updated Vital Signs BP 141/75 (BP Location: Left Arm)   Pulse 68   Temp 99.4 F (37.4 C) (Oral)   Resp 18   Ht 5\' 9"  (1.753 m)   Wt 295 lb (133.8 kg)   LMP 01/07/2016 (Exact Date)   SpO2 100%   BMI 43.56 kg/m   Physical Exam  Constitutional: She is oriented to person, place, and time. She appears well-developed and well-nourished.  Obese  HENT:    Head: Normocephalic and atraumatic.  Cardiovascular: Normal rate, regular rhythm and normal heart sounds.   No murmur heard. Pulmonary/Chest: Effort normal and breath sounds normal. No respiratory distress. She has no wheezes.  Abdominal: Soft. Bowel sounds are normal. There is no tenderness. There is no guarding.  Neurological: She is alert and oriented to person, place, and time.  Skin: Skin is warm and dry.  Psychiatric: She has a normal mood and affect.  Nursing note and vitals reviewed.  ED Treatments / Results  DIAGNOSTIC STUDIES: Oxygen Saturation is 100% on RA, normal by my interpretation.   COORDINATION OF CARE: 11:56 PM-Discussed next steps with pt. Pt verbalized understanding and is agreeable with the plan.   Labs (all labs ordered are listed, but only abnormal results are displayed) Labs Reviewed  URINALYSIS, ROUTINE W REFLEX MICROSCOPIC - Abnormal; Notable for the following:       Result Value   APPearance HAZY (*)    Specific Gravity, Urine 1.035 (*)    Ketones, ur 5 (*)    Protein, ur 30 (*)    Squamous Epithelial / LPF 0-5 (*)    All other components within normal limits  LIPASE, BLOOD  COMPREHENSIVE METABOLIC PANEL  CBC  HCG, QUANTITATIVE, PREGNANCY   EKG  EKG Interpretation None      Radiology No results found.  Procedures Procedures   Medications Ordered in ED Medications - No data to display  Initial Impression / Assessment and Plan / ED Course  I have reviewed the triage vital signs and the nursing notes.  Pertinent labs & imaging results that were available during my care of the patient were reviewed by me and considered in my medical decision making (see chart for details).  Clinical Course    Patient initially presented with a chief complaint of abdominal pain. She endorses to me that she really just wants to know whether she is pregnant. She does not have any additional symptoms at this time. Recent screening for STDs. Her exam is  benign. She was offered a pelvic exam but declined. Pregnancy testing is negative.  After history, exam, and medical workup I feel the patient has been appropriately medically screened and is safe for discharge home. Pertinent diagnoses were discussed with the patient. Patient was given return precautions.   Final Clinical Impressions(s) / ED Diagnoses   Final diagnoses:  Pregnancy test negative   New Prescriptions New Prescriptions   No medications on file   I personally performed the services described in this documentation, which was scribed in my presence. The recorded information has been reviewed and is accurate.     Shon Batonourtney F Horton, MD 02/06/16 [phone]

## 2016-02-05 NOTE — ED Triage Notes (Signed)
Pt presents stating she missed her period 4 days ago and has had left lower abd cramping since. Denies n/v/d. Last BM today. LMP 01/07/16.

## 2016-02-06 NOTE — Discharge Instructions (Signed)
You were seen today after missing a period and having pregnancy symptoms. Your pregnancy test is negative. You had negative STD screening less than 3 weeks ago. If you have any new or worsening [facility]

## 2016-05-25 ENCOUNTER — Encounter (HOSPITAL_COMMUNITY): Payer: Self-pay

## 2016-05-25 ENCOUNTER — Emergency Department (HOSPITAL_COMMUNITY)
Admission: EM | Admit: 2016-05-25 | Discharge: 2016-05-25 | Disposition: A | Discharge status: Home / Self Care | Payer: 59 | Attending: Emergency Medicine | Admitting: Emergency Medicine

## 2016-05-25 DIAGNOSIS — S0502XA Injury of conjunctiva and corneal abrasion without foreign body, left eye, initial encounter: Secondary | ICD-10-CM | POA: Insufficient documentation

## 2016-05-25 DIAGNOSIS — Y999 Unspecified external cause status: Secondary | ICD-10-CM | POA: Insufficient documentation

## 2016-05-25 DIAGNOSIS — X58XXXA Exposure to other specified factors, initial encounter: Secondary | ICD-10-CM | POA: Insufficient documentation

## 2016-05-25 DIAGNOSIS — Y939 Activity, unspecified: Secondary | ICD-10-CM | POA: Insufficient documentation

## 2016-05-25 DIAGNOSIS — Y929 Unspecified place or not applicable: Secondary | ICD-10-CM | POA: Insufficient documentation

## 2016-05-25 MED ORDER — TETANUS-DIPHTH-ACELL PERTUSSIS 5-2.5-18.5 LF-MCG/0.5 IM SUSP
0.5000 mL | Freq: Once | INTRAMUSCULAR | Status: AC
  Administered 2016-05-25: 0.5 mL via INTRAMUSCULAR
  Filled 2016-05-25: qty 0.5

## 2016-05-25 MED ORDER — TETRACAINE HCL 0.5 % OP SOLN
1.0000 [drp] | Freq: Once | OPHTHALMIC | Status: AC
  Administered 2016-05-25: 1 [drp] via OPHTHALMIC

## 2016-05-25 MED ORDER — CIPROFLOXACIN HCL 0.3 % OP OINT: TOPICAL_OINTMENT | OPHTHALMIC | 0 refills | Status: DC

## 2016-05-25 MED ORDER — FLUORESCEIN SODIUM 0.6 MG OP STRP
1.0000 | ORAL_STRIP | Freq: Once | OPHTHALMIC | Status: AC
  Administered 2016-05-25: 1 via OPHTHALMIC
  Filled 2016-05-25: qty 1

## 2016-05-25 MED ORDER — TETRACAINE HCL 0.5 % OP SOLN
1.0000 [drp] | Freq: Once | OPHTHALMIC | Status: DC
  Filled 2016-05-25: qty 2

## 2016-05-25 NOTE — Discharge Instructions (Signed)
As discussed, do not wear your contact lenses until completely healed. Use your antibiotic as prescribed. Follow up with ophthalmology first thing in the morning.  Return to the emergency department if condition worsens or any new concerning symptoms in the meantime.

## 2016-05-25 NOTE — ED Triage Notes (Signed)
Patient here with left eye redness and eyelid swelling x 1 week. Drainage and crusting during the night with photophobia and itching

## 2016-05-25 NOTE — ED Provider Notes (Signed)
[facility] Provider Note   CSN: [id number] Arrival date & time: 05/25/16  9562  By signing my name below, I, Linna Darner, attest that this documentation has been prepared under the direction and in the presence of Mathews Robinsons, PA-C. Electronically Signed: Linna Darner, Scribe. 05/25/2016. 11:04 AM.  History   Chief Complaint Chief Complaint  Patient presents with  . Eye Drainage   The history is provided by the patient. No language interpreter was used.    HPI Comments: Debra Becker is a 21 y.o. female who presents to the Emergency Department complaining of constant, gradually worsening, left eye irritation beginning 6 days ago. She reports redness, blurred vision, itching, and mild dull pain to her left eye. Patient states she awoke this morning and her left eye was "swollen shut" with some matting discharge. She notes she removed the contact lens from her left eye six days ago and felt like something "got in my eye" at that time. She notes she no longer feels as though there is something in her left eye. Patient notes she has been rubbing her left eye frequently. She has tried OTC eyedrops and Benadryl without improvement of her symptoms. Patient has worn contact lenses for an extended period of time. She denies photophobia, fevers, chills, or any other associated symptoms.  History reviewed. No pertinent past medical history.  There are no active problems to display for this patient.   Past Surgical History:  Procedure Laterality Date  . MOUTH SURGERY      OB History    No data available       Home Medications    Prior to Admission medications   Medication Sig Start Date End Date Taking? Authorizing Provider  acetaminophen (TYLENOL) 325 MG tablet Take 650 mg by mouth every 6 (six) hours as needed for mild pain.    Historical Provider, MD  ciprofloxacin (CILOXAN) 0.3 % ophthalmic ointment (Ophthalmic ointment 0.3%) Apply one-half inch ribbon into  conjunctival sac 3 times daily for the first 2 days, then 2 times daily for the next 5 days 05/25/16   Georgiana Shore, PA-C  famotidine (PEPCID) 20 MG tablet Take 1 tablet (20 mg total) by mouth 2 (two) times daily. Patient not taking: Reported on 09/02/2015 04/05/14   Antony Madura, PA-C  HYDROcodone-acetaminophen (NORCO/VICODIN) 5-325 MG per tablet Take 2 tablets by mouth every 6 (six) hours as needed for moderate pain or severe pain. Patient not taking: Reported on 09/02/2015 03/13/14   Roxy Horseman, PA-C  ibuprofen (ADVIL,MOTRIN) 800 MG tablet Take 1 tablet (800 mg total) by mouth 3 (three) times daily. Patient not taking: Reported on 09/02/2015 01/20/15   Shawn C Joy, PA-C  metroNIDAZOLE (FLAGYL) 500 MG tablet Take 1 tablet (500 mg total) by mouth 2 (two) times daily. 09/02/15   Chase Picket Ward, PA-C  predniSONE (DELTASONE) 20 MG tablet Take 2 tablets (40 mg total) by mouth daily. Patient not taking: Reported on 09/02/2015 04/05/14   Antony Madura, PA-C  PRESCRIPTION MEDICATION Take 1 tablet by mouth daily. "Birth Control"    Historical Provider, MD    Family History No family history on file.  Social History Social History  Substance Use Topics  . Smoking status: Never Smoker  . Smokeless tobacco: Never Used  . Alcohol use Yes     Comment: occ     Allergies   Chocolate   Review of Systems Review of Systems  Constitutional: Negative for chills and fever.  Eyes: Positive for pain,  discharge, redness, itching and visual disturbance. Negative for photophobia.   Physical Exam Updated Vital Signs BP 137/82   Pulse 66   Temp 97.3 F (36.3 C) (Oral)   Resp 18   SpO2 99%   Physical Exam  Constitutional: She is oriented to person, place, and time. She appears well-developed and well-nourished. No distress.  Patient is afebrile, non-toxic appearing, sitting comfortably in chair in no acute distress.  HENT:  Head: Normocephalic and atraumatic.  Eyes: EOM are normal. Pupils are equal,  round, and reactive to light. Lids are everted and swept, no foreign bodies found. Left conjunctiva is injected.  Slit lamp exam:      The left eye shows corneal abrasion and fluorescein uptake.  Injected sclera on the left. Upper left eyelid is edematous. No pain with pupillary reflex. Corneal abrasion noted to left eye. Fluorescein uptake to left lower sclera.  Neck: Neck supple. No tracheal deviation present.  Cardiovascular: Normal rate, regular rhythm and normal heart sounds.   Pulmonary/Chest: Effort normal and breath sounds normal. No respiratory distress.  Musculoskeletal: Normal range of motion.  Neurological: She is alert and oriented to person, place, and time.  Skin: Skin is warm and dry.  Psychiatric: She has a normal mood and affect. Her behavior is normal.  Nursing note and vitals reviewed.    Visual Acuity  Right Eye Distance: 10/20 Left Eye Distance: 10/10 Bilateral Distance: 10/20   ED Treatments / Results  Labs (all labs ordered are listed, but only abnormal results are displayed) Labs Reviewed - No data to display  EKG  EKG Interpretation None       Radiology No results found.  Procedures Procedures (including critical care time)  DIAGNOSTIC STUDIES: Oxygen Saturation is 99% on RA, normal by my interpretation.    COORDINATION OF CARE: 11:12 AM Discussed treatment plan with pt at bedside and pt agreed to plan.  Medications Ordered in ED Medications  fluorescein ophthalmic strip 1 strip (1 strip Left Eye Given 05/25/16 1135)  tetracaine (PONTOCAINE) 0.5 % ophthalmic solution 1 drop (1 drop Left Eye Given 05/25/16 1141)  Tdap (BOOSTRIX) injection 0.5 mL (0.5 mLs Intramuscular Given 05/25/16 1233)     Initial Impression / Assessment and Plan / ED Course  I have reviewed the triage vital signs and the nursing notes.  Pertinent labs & imaging results that were available during my care of the patient were reviewed by me and considered in my medical  decision making (see chart for details).       Patient presents with left corneal abrasion at 3 o'clock found under fluorescein woods lamp. Patient is a contact lens wearer.   No foreign body noted on eyelid eversion. We'll discharge home with follow-up with ophthalmology tomorrow. Cipro ophthalmic and symptomatic relief.  Patient was discussed with Dr. Rhunette Croft who has seen patient and agrees with assessment and plan. Discussed strict return precautions and advised to return to the emergency department if experiencing any new or worsening symptoms. Instructions were understood and patient agreed with discharge plan.  Final Clinical Impressions(s) / ED Diagnoses  Discussed strict return precautions and advised to return to the emergency department if experiencing any new or worsening symptoms. Instructions were understood and patient agreed with discharge plan. Final diagnoses:  Abrasion of left cornea, initial encounter    New Prescriptions New Prescriptions   CIPROFLOXACIN (CILOXAN) 0.3 % OPHTHALMIC OINTMENT    (Ophthalmic ointment 0.3%) Apply one-half inch ribbon into conjunctival sac 3 times daily for the  first 2 days, then 2 times daily for the next 5 days   I personally performed the services described in this documentation, which was scribed in my presence. The recorded information has been reviewed and is accurate.    Georgiana Shore, PA-C 05/25/16 1243    Samuel Jester, DO 05/28/16 [phone]

## 2017-03-08 ENCOUNTER — Other Ambulatory Visit: Payer: Self-pay

## 2017-03-08 ENCOUNTER — Emergency Department (HOSPITAL_COMMUNITY)
Admission: EM | Admit: 2017-03-08 | Discharge: 2017-03-08 | Disposition: A | Discharge status: Home / Self Care | Payer: 59 | Attending: Emergency Medicine | Admitting: Emergency Medicine

## 2017-03-08 ENCOUNTER — Encounter (HOSPITAL_COMMUNITY): Payer: Self-pay | Admitting: Emergency Medicine

## 2017-03-08 DIAGNOSIS — N390 Urinary tract infection, site not specified: Secondary | ICD-10-CM | POA: Insufficient documentation

## 2017-03-08 LAB — URINALYSIS, ROUTINE W REFLEX MICROSCOPIC
Bilirubin Urine: NEGATIVE
Glucose, UA: NEGATIVE mg/dL
Hgb urine dipstick: NEGATIVE
Ketones, ur: NEGATIVE mg/dL
Nitrite: NEGATIVE
PH: 5 (ref 5.0–8.0)
Protein, ur: NEGATIVE mg/dL
SPECIFIC GRAVITY, URINE: 1.025 (ref 1.005–1.030)

## 2017-03-08 LAB — POC URINE PREG, ED: Preg Test, Ur: NEGATIVE

## 2017-03-08 MED ORDER — CEPHALEXIN 500 MG PO CAPS: 500.0000 mg | ORAL_CAPSULE | Freq: Two times a day (BID) | ORAL | 0 refills | Status: AC

## 2017-03-08 MED ORDER — PHENAZOPYRIDINE HCL 95 MG PO TABS: 95.0000 mg | ORAL_TABLET | Freq: Three times a day (TID) | ORAL | 0 refills | Status: AC | PRN

## 2017-03-08 NOTE — ED Triage Notes (Signed)
Reports low back pain for over a week, thought it was related to cycle.  Now having urinary frequency and discomfort.

## 2017-03-08 NOTE — ED Provider Notes (Signed)
[facility]   CSN: [id number] Arrival date & time: 03/08/17  0204     History   Chief Complaint Chief Complaint  Patient presents with  . urinary difficulty    HPI Debra Becker is a 22 y.o. female with no significant past medical history, who presents to ED for evaluation of one week history of lower back pain which she thought was related to her menstrual cycle (which is due in 2 days), and urinary frequency, discomfort described as "pinching sensation." She denies any previous history of similar symptoms.  She denies any abdominal pain, vaginal discharge, abnormal vaginal bleeding, diarrhea, constipation, nausea, vomiting or fevers.  HPI  History reviewed. No pertinent past medical history.  There are no active problems to display for this patient.   Past Surgical History:  Procedure Laterality Date  . MOUTH SURGERY      OB History    No data available       Home Medications    Prior to Admission medications   Medication Sig Start Date End Date Taking? Authorizing Provider  acetaminophen (TYLENOL) 325 MG tablet Take 650 mg by mouth every 6 (six) hours as needed for mild pain.    [provider]  cephALEXin (KEFLEX) 500 MG capsule Take 1 capsule (500 mg total) by mouth 2 (two) times daily for 7 days. 03/08/17 03/15/17  Dietrich PatesKhatri, Tacha Manni, PA-C  ciprofloxacin (CILOXAN) 0.3 % ophthalmic ointment (Ophthalmic ointment 0.3%) Apply one-half inch ribbon into conjunctival sac 3 times daily for the first 2 days, then 2 times daily for the next 5 days 05/25/16   Mathews RobinsonsMitchell, Jessica B, PA-C  famotidine (PEPCID) 20 MG tablet Take 1 tablet (20 mg total) by mouth 2 (two) times daily. Patient not taking: Reported on 09/02/2015 04/05/14   Antony MaduraHumes, Kelly, PA-C  HYDROcodone-acetaminophen (NORCO/VICODIN) 5-325 MG per tablet Take 2 tablets by mouth every 6 (six) hours as needed for moderate pain or severe pain. Patient not taking: Reported  on 09/02/2015 03/13/14   Roxy HorsemanBrowning, Robert, PA-C  ibuprofen (ADVIL,MOTRIN) 800 MG tablet Take 1 tablet (800 mg total) by mouth 3 (three) times daily. Patient not taking: Reported on 09/02/2015 01/20/15   Anselm PancoastJoy, Shawn C, PA-C  metroNIDAZOLE (FLAGYL) 500 MG tablet Take 1 tablet (500 mg total) by mouth 2 (two) times daily. 09/02/15   Ward, Chase PicketJaime Pilcher, PA-C  phenazopyridine (PYRIDIUM) 95 MG tablet Take 1 tablet (95 mg total) by mouth 3 (three) times daily as needed for pain. 03/08/17   Theta Leaf, PA-C  predniSONE (DELTASONE) 20 MG tablet Take 2 tablets (40 mg total) by mouth daily. Patient not taking: Reported on 09/02/2015 04/05/14   Antony MaduraHumes, Kelly, PA-C  PRESCRIPTION MEDICATION Take 1 tablet by mouth daily. "Birth Control"    [provider]    Family History No family history on file.  Social History Social History   Tobacco Use  . Smoking status: Never Smoker  . Smokeless tobacco: Never Used  Substance Use Topics  . Alcohol use: Yes    Comment: occ  . Drug use: No     Allergies   Chocolate   Review of Systems Review of Systems  Constitutional: Negative for chills and fever.  Gastrointestinal: Negative for abdominal pain, constipation, diarrhea, nausea and vomiting.  Genitourinary: Positive for dysuria and frequency. Negative for enuresis, flank pain, hematuria, pelvic pain, vaginal bleeding and vaginal pain.  Musculoskeletal: Positive for back pain.     Physical Exam Updated Vital Signs BP  133/70   Pulse 71   Temp 98.5 F (36.9 C) (Oral)   Resp 18   Ht 5\' 9"  (1.753 m)   Wt 131.5 kg (290 lb)   SpO2 100%   BMI 42.83 kg/m   Physical Exam  Constitutional: She appears well-developed and well-nourished. No distress.  Nontoxic appearing and in no acute distress.  HENT:  Head: Normocephalic and atraumatic.  Eyes: Conjunctivae and EOM are normal. No scleral icterus.  Neck: Normal range of motion.  Pulmonary/Chest: Effort normal. No respiratory distress.  Abdominal:  Soft. There is no tenderness.  No CVA tenderness noted bilaterally.  Neurological: She is alert.  Skin: No rash noted. She is not diaphoretic.  Psychiatric: She has a normal mood and affect.  Nursing note and vitals reviewed.    ED Treatments / Results  Labs (all labs ordered are listed, but only abnormal results are displayed) Labs Reviewed  URINALYSIS, ROUTINE W REFLEX MICROSCOPIC - Abnormal; Notable for the following components:      Result Value   Leukocytes, UA SMALL (*)    Bacteria, UA RARE (*)    Squamous Epithelial / LPF 0-5 (*)    All other components within normal limits  URINE CULTURE  POC URINE PREG, ED    EKG  EKG Interpretation None       Radiology No results found.  Procedures Procedures (including critical care time)  Medications Ordered in ED Medications - No data to display   Initial Impression / Assessment and Plan / ED Course  I have reviewed the triage vital signs and the nursing notes.  Pertinent labs & imaging results that were available during my care of the patient were reviewed by me and considered in my medical decision making (see chart for details).     Patient presents to ED for evaluation of low back pain, urinary frequency and dysuria for the past week. No previous history of similar symptoms in the past. She is afebrile with no history of fever. No CVA tenderness that would concern me for pyelonephritis. No vomiting, abdominal pain, bowel changes, vaginal discharge or abnormal bleeding. Upreg negative. UA +leukocytes, WBC, bacteria, but will send for culture. Will tx with Keflex for now, pyridium prn for discomfort. Patient appears stable fr discharge at this time. Strict return precautions given.  Portions of this note were generated with Scientist, clinical (histocompatibility and immunogenetics). Dictation errors may occur despite best attempts at proofreading.   Final Clinical Impressions(s) / ED Diagnoses   Final diagnoses:  Lower urinary tract infectious  disease    ED Discharge Orders        Ordered    cephALEXin (KEFLEX) 500 MG capsule  2 times daily     03/08/17 0648    phenazopyridine (PYRIDIUM) 95 MG tablet  3 times daily PRN     03/08/17 0648       Dietrich Pates, PA-C 03/08/17 1610    Shon Baton, MD 03/08/17 2255

## 2017-03-08 NOTE — Discharge Instructions (Signed)
Please read attached information regarding your condition. Take Keflex twice daily for 7 days. We will contact you with the results of your urine culture when available if you need to be on a different antibiotic. Take Pyridium as needed for urinary discomfort. Return to ED for worsening symptoms, severe back or abdominal pain, vomiting, blood in urine.

## 2017-03-09 LAB — URINE CULTURE

## 2017-08-15 ENCOUNTER — Other Ambulatory Visit: Payer: Self-pay

## 2017-08-15 ENCOUNTER — Encounter (HOSPITAL_COMMUNITY): Payer: Self-pay | Admitting: Emergency Medicine

## 2017-08-15 ENCOUNTER — Emergency Department (HOSPITAL_COMMUNITY)
Admission: EM | Admit: 2017-08-15 | Discharge: 2017-08-16 | Disposition: A | Discharge status: Home / Self Care | Payer: Self-pay | Attending: Emergency Medicine | Admitting: Emergency Medicine

## 2017-08-15 DIAGNOSIS — F17200 Nicotine dependence, unspecified, uncomplicated: Secondary | ICD-10-CM | POA: Insufficient documentation

## 2017-08-15 DIAGNOSIS — J039 Acute tonsillitis, unspecified: Secondary | ICD-10-CM | POA: Insufficient documentation

## 2017-08-15 DIAGNOSIS — Z79899 Other long term (current) drug therapy: Secondary | ICD-10-CM | POA: Insufficient documentation

## 2017-08-15 DIAGNOSIS — J029 Acute pharyngitis, unspecified: Secondary | ICD-10-CM

## 2017-08-15 LAB — BASIC METABOLIC PANEL
ANION GAP: 12 (ref 5–15)
BUN: 6 mg/dL (ref 6–20)
CO2: 23 mmol/L (ref 22–32)
Calcium: 9 mg/dL (ref 8.9–10.3)
Chloride: 103 mmol/L (ref 98–111)
Creatinine, Ser: 1.05 mg/dL — ABNORMAL HIGH (ref 0.44–1.00)
Glucose, Bld: 112 mg/dL — ABNORMAL HIGH (ref 70–99)
POTASSIUM: 3.6 mmol/L (ref 3.5–5.1)
SODIUM: 138 mmol/L (ref 135–145)

## 2017-08-15 LAB — CBC WITH DIFFERENTIAL/PLATELET
BASOS ABS: 0 10*3/uL (ref 0.0–0.1)
Basophils Relative: 0 %
EOS ABS: 0 10*3/uL (ref 0.0–0.7)
Eosinophils Relative: 0 %
HCT: 41.3 % (ref 36.0–46.0)
Hemoglobin: 13.5 g/dL (ref 12.0–15.0)
Lymphocytes Relative: 6 %
Lymphs Abs: 1.5 10*3/uL (ref 0.7–4.0)
MCH: 28.2 pg (ref 26.0–34.0)
MCHC: 32.7 g/dL (ref 30.0–36.0)
MCV: 86.2 fL (ref 78.0–100.0)
MONO ABS: 1.7 10*3/uL — AB (ref 0.1–1.0)
Monocytes Relative: 7 %
NEUTROS PCT: 87 %
Neutro Abs: 21.5 10*3/uL — ABNORMAL HIGH (ref 1.7–7.7)
PLATELETS: 229 10*3/uL (ref 150–400)
RBC: 4.79 MIL/uL (ref 3.87–5.11)
RDW: 13.2 % (ref 11.5–15.5)
WBC: 24.7 10*3/uL — AB (ref 4.0–10.5)

## 2017-08-15 LAB — I-STAT CG4 LACTIC ACID, ED: Lactic Acid, Venous: 1.64 mmol/L (ref 0.5–1.9)

## 2017-08-15 LAB — I-STAT BETA HCG BLOOD, ED (MC, WL, AP ONLY): HCG, QUANTITATIVE: 9.3 m[IU]/mL — AB (ref <5)

## 2017-08-15 LAB — GROUP A STREP BY PCR: Group A Strep by PCR: NOT DETECTED

## 2017-08-15 MED ORDER — CLINDAMYCIN PHOSPHATE 600 MG/50ML IV SOLN
600.0000 mg | Freq: Once | INTRAVENOUS | Status: AC
  Administered 2017-08-15: 600 mg via INTRAVENOUS
  Filled 2017-08-15: qty 50

## 2017-08-15 MED ORDER — DEXAMETHASONE SODIUM PHOSPHATE 10 MG/ML IJ SOLN
10.0000 mg | Freq: Once | INTRAMUSCULAR | Status: AC
  Administered 2017-08-15: 10 mg via INTRAVENOUS
  Filled 2017-08-15: qty 1

## 2017-08-15 MED ORDER — ACETAMINOPHEN 160 MG/5ML PO SOLN
1000.0000 mg | Freq: Once | ORAL | Status: AC
  Administered 2017-08-15: 1000 mg via ORAL
  Filled 2017-08-15: qty 40.6

## 2017-08-15 MED ORDER — SODIUM CHLORIDE 0.9 % IV BOLUS
2000.0000 mL | Freq: Once | INTRAVENOUS | Status: AC
Start: 2017-08-15 — End: 2017-08-16
  Administered 2017-08-15: 2000 mL via INTRAVENOUS

## 2017-08-15 NOTE — ED Triage Notes (Signed)
Pt c/o sore throat x several days, chills, HA. Pt c/o HA, temp 99 last night.

## 2017-08-15 NOTE — ED Provider Notes (Signed)
[facility] EMERGENCY DEPARTMENT Provider Note   CSN: [id number] Arrival date & time: 08/15/17  1952     History   Chief Complaint Chief Complaint  Patient presents with  . Sore Throat    HPI Debra Becker is a 22 y.o. female who presents today for evaluation of sore throat.  This is been present for about 2 days and gradually worsening.  She reports that she has been having cold chills and just feels very cold recently.  She has been taking TheraFlu, has not had any in the past few hours for her symptoms.  She reports that the left side started hurting first and generally hurts more than the right side.  She reports she has chronic problems with her tonsils but this is worse than usual.   HPI  History reviewed. No pertinent past medical history.  There are no active problems to display for this patient.   Past Surgical History:  Procedure Laterality Date  . MOUTH SURGERY       OB History   None      Home Medications    Prior to Admission medications   Medication Sig Start Date End Date Taking? Authorizing Provider  acetaminophen (TYLENOL) 325 MG tablet Take 650 mg by mouth every 6 (six) hours as needed for mild pain.    [provider]  ciprofloxacin (CILOXAN) 0.3 % ophthalmic ointment (Ophthalmic ointment 0.3%) Apply one-half inch ribbon into conjunctival sac 3 times daily for the first 2 days, then 2 times daily for the next 5 days 05/25/16   Mathews Robinsons B, PA-C  clindamycin (CLEOCIN) 150 MG capsule Take 2 capsules (300 mg total) by mouth 3 (three) times daily for 10 days. 08/16/17 08/26/17  Cristina Gong, PA-C  famotidine (PEPCID) 20 MG tablet Take 1 tablet (20 mg total) by mouth 2 (two) times daily. Patient not taking: Reported on 09/02/2015 04/05/14   Antony Madura, PA-C  HYDROcodone-acetaminophen (NORCO/VICODIN) 5-325 MG per tablet Take 2 tablets by mouth every 6 (six) hours as needed for moderate pain or severe pain. Patient  not taking: Reported on 09/02/2015 03/13/14   Roxy Horseman, PA-C  ibuprofen (ADVIL,MOTRIN) 800 MG tablet Take 1 tablet (800 mg total) by mouth 3 (three) times daily. Patient not taking: Reported on 09/02/2015 01/20/15   Anselm Pancoast, PA-C  metroNIDAZOLE (FLAGYL) 500 MG tablet Take 1 tablet (500 mg total) by mouth 2 (two) times daily. 09/02/15   Ward, Chase Picket, PA-C  phenazopyridine (PYRIDIUM) 95 MG tablet Take 1 tablet (95 mg total) by mouth 3 (three) times daily as needed for pain. 03/08/17   Khatri, Hina, PA-C  predniSONE (DELTASONE) 20 MG tablet Take 2 tablets (40 mg total) by mouth daily. Patient not taking: Reported on 09/02/2015 04/05/14   Antony Madura, PA-C  PRESCRIPTION MEDICATION Take 1 tablet by mouth daily. "Birth Control"    [provider]    Family History No family history on file.  Social History Social History   Tobacco Use  . Smoking status: Current Every Day Smoker  . Smokeless tobacco: Never Used  Substance Use Topics  . Alcohol use: Yes    Comment: occ  . Drug use: No     Allergies   Chocolate   Review of Systems Review of Systems  Constitutional: Positive for chills. Negative for fever.  HENT: Positive for sore throat. Negative for trouble swallowing.   Respiratory: Negative for shortness of breath.   Gastrointestinal: Negative for abdominal pain  and nausea.  Neurological: Negative for headaches.  All other systems reviewed and are negative.    Physical Exam Updated Vital Signs BP (!) 141/68 (BP Location: Right Arm)   Pulse (!) 105   Temp (!) 102 F (38.9 C) (Oral)   Resp 16   Ht 5\' 9"  (1.753 m)   Wt 127 kg (280 lb)   LMP 07/24/2017   SpO2 99%   BMI 41.35 kg/m   Physical Exam  Constitutional: She is oriented to person, place, and time. She appears well-developed and well-nourished. She appears ill. No distress.  Patient is shivering  HENT:  Head: Normocephalic and atraumatic.  Right Ear: Tympanic membrane and ear canal normal.    Left Ear: Tympanic membrane and ear canal normal.  Mouth/Throat: Mucous membranes are normal. Posterior oropharyngeal edema (Uvula is deviated to the right.) and posterior oropharyngeal erythema present. Tonsils are 4+ on the right. Tonsils are 4+ on the left. Tonsillar exudate.  Eyes: Conjunctivae are normal. Right eye exhibits no discharge. Left eye exhibits no discharge. No scleral icterus.  Neck: Normal range of motion.  Cardiovascular: Normal rate, regular rhythm and normal heart sounds.  Pulmonary/Chest: Effort normal and breath sounds normal. No stridor. No respiratory distress.  Abdominal: She exhibits no distension.  Musculoskeletal: She exhibits no edema or deformity.  Neurological: She is alert and oriented to person, place, and time. She exhibits normal muscle tone.  Skin: Skin is warm and dry. She is not diaphoretic.  Psychiatric: She has a normal mood and affect. Her behavior is normal.  Nursing note and vitals reviewed.    ED Treatments / Results  Labs (all labs ordered are listed, but only abnormal results are displayed) Labs Reviewed  CBC WITH DIFFERENTIAL/PLATELET - Abnormal; Notable for the following components:      Result Value   WBC 24.7 (*)    Neutro Abs 21.5 (*)    Monocytes Absolute 1.7 (*)    All other components within normal limits  BASIC METABOLIC PANEL - Abnormal; Notable for the following components:   Glucose, Bld 112 (*)    Creatinine, Ser 1.05 (*)    All other components within normal limits  I-STAT BETA HCG BLOOD, ED ([facility], [facility], [facility] ONLY) - Abnormal; Notable for the following components:   I-stat hCG, quantitative 9.3 (*)    All other components within normal limits  GROUP A STREP BY PCR  CULTURE, BLOOD (ROUTINE X 2)  CULTURE, BLOOD (ROUTINE X 2)  I-STAT CG4 LACTIC ACID, ED  POC URINE PREG, ED  I-STAT CG4 LACTIC ACID, ED    EKG None  Radiology No results found.  Procedures Procedures (including critical care time)  Medications Ordered  in ED Medications  ibuprofen (ADVIL,MOTRIN) tablet 600 mg (has no administration in time range)  acetaminophen (TYLENOL) solution 1,000 mg (1,000 mg Oral Given 08/15/17 2305)  sodium chloride 0.9 % bolus 2,000 mL (2,000 mLs Intravenous New Bag/Given 08/15/17 2310)  clindamycin (CLEOCIN) IVPB 600 mg (600 mg Intravenous New Bag/Given 08/15/17 2340)  dexamethasone (DECADRON) injection 10 mg (10 mg Intravenous Given 08/15/17 2306)     Initial Impression / Assessment and Plan / ED Course  I have reviewed the triage vital signs and the nursing notes.  Pertinent labs & imaging results that were available during my care of the patient were reviewed by me and considered in my medical decision making (see chart for details).    Patient presents today for evaluation of sore throat which has significantly worsened.  She is not drooling and able to tolerate her secretions well, airway is patent.  She is tachycardic and febrile, I suspect the tachycardia is primarily due to the fever.  Her white count is elevated at 24.7 with 21.5 neutrophils.  She was treated with p.o. Tylenol, and ibuprofen.  She was given 2 L of IV fluids.  Her lactic is not elevated, and she is not hypotensive, I do not suspect that patient has is septic, rather that her tachycardia is from the fever.  Nevertheless blood cultures were drawn.  She was started on IV clindamycin, and given IV Decadron to help with the swelling.  I-STAT hCG was slightly elevated at 9.  Urine pregnancy was negative, therefore CT scan will be obtained.  At shift change care was transferred to [facility] who will follow pending studies, re-evaulate and determine disposition.    This patient was discussed with Dr. Rubin Payor who agreed not to call code sepsis.  Final Clinical Impressions(s) / ED Diagnoses   Final diagnoses:  Sore throat    ED Discharge Orders        Ordered    clindamycin (CLEOCIN) 150 MG capsule  3 times daily     08/16/17 0035        Cristina Gong, PA-C 08/16/17 0050    Benjiman Core, MD 08/16/17 [phone]

## 2017-08-16 ENCOUNTER — Emergency Department (HOSPITAL_COMMUNITY): Payer: Self-pay

## 2017-08-16 LAB — POC URINE PREG, ED: PREG TEST UR: NEGATIVE

## 2017-08-16 MED ORDER — IOHEXOL 300 MG/ML  SOLN
75.0000 mL | Freq: Once | INTRAMUSCULAR | Status: AC | PRN
  Administered 2017-08-16: 100 mL via INTRAVENOUS

## 2017-08-16 MED ORDER — CLINDAMYCIN HCL 150 MG PO CAPS: 300.0000 mg | ORAL_CAPSULE | Freq: Three times a day (TID) | ORAL | 0 refills | Status: AC

## 2017-08-16 MED ORDER — IBUPROFEN 200 MG PO TABS
600.0000 mg | ORAL_TABLET | Freq: Once | ORAL | Status: AC
  Administered 2017-08-16: 400 mg via ORAL
  Filled 2017-08-16: qty 3

## 2017-08-16 NOTE — ED Provider Notes (Signed)
2:46 AM Patient care assumed from Lyndel SafeElizabeth Hammond, PA-C at change of shift.  Patient pending CT scan to evaluate for peritonsillar abscess.    Imaging has been completed and shows findings consistent with tonsillitis.  No evidence of abscess or drainable fluid collection.  The patient has continued to tolerate secretions.  She is nontoxic in appearance with no change in phonation or tripoding.  No nuchal rigidity or meningismus.  Will continue with outpatient clindamycin.  Initial IV dose given prior to discharge.  Return precautions discussed and provided. Patient discharged in stable condition with no unaddressed concerns.   Results for orders placed or performed during the hospital encounter of 08/15/17  Group A Strep by PCR  Result Value Ref Range   Group A Strep by PCR NOT DETECTED NOT DETECTED  CBC with Differential  Result Value Ref Range   WBC 24.7 (H) 4.0 - 10.5 K/uL   RBC 4.79 3.87 - 5.11 MIL/uL   Hemoglobin 13.5 12.0 - 15.0 g/dL   HCT 72.541.3 36.636.0 - 44.046.0 %   MCV 86.2 78.0 - 100.0 fL   MCH 28.2 26.0 - 34.0 pg   MCHC 32.7 30.0 - 36.0 g/dL   RDW 34.713.2 42.511.5 - 95.615.5 %   Platelets 229 150 - 400 K/uL   Neutrophils Relative % 87 %   Lymphocytes Relative 6 %   Monocytes Relative 7 %   Eosinophils Relative 0 %   Basophils Relative 0 %   Neutro Abs 21.5 (H) 1.7 - 7.7 K/uL   Lymphs Abs 1.5 0.7 - 4.0 K/uL   Monocytes Absolute 1.7 (H) 0.1 - 1.0 K/uL   Eosinophils Absolute 0.0 0.0 - 0.7 K/uL   Basophils Absolute 0.0 0.0 - 0.1 K/uL   Smear Review MORPHOLOGY UNREMARKABLE   Basic metabolic panel  Result Value Ref Range   Sodium 138 135 - 145 mmol/L   Potassium 3.6 3.5 - 5.1 mmol/L   Chloride 103 98 - 111 mmol/L   CO2 23 22 - 32 mmol/L   Glucose, Bld 112 (H) 70 - 99 mg/dL   BUN 6 6 - 20 mg/dL   Creatinine, Ser 3.871.05 (H) 0.44 - 1.00 mg/dL   Calcium 9.0 8.9 - 56.410.3 mg/dL   GFR calc non Af Amer >60 >60 mL/min   GFR calc Af Amer >60 >60 mL/min   Anion gap 12 5 - 15  I-Stat beta hCG blood,  ED  Result Value Ref Range   I-stat hCG, quantitative 9.3 (H) <5 mIU/mL   Comment 3          I-Stat CG4 Lactic Acid, ED  Result Value Ref Range   Lactic Acid, Venous 1.64 0.5 - 1.9 mmol/L  POC urine preg, ED  Result Value Ref Range   Preg Test, Ur NEGATIVE NEGATIVE   Ct Soft Tissue Neck W Contrast  Result Date: 08/16/2017 CLINICAL DATA:  Initial evaluation for acute sore throat EXAM: CT NECK WITH CONTRAST TECHNIQUE: Multidetector CT imaging of the neck was performed using the standard protocol following the bolus administration of intravenous contrast. CONTRAST:  100mL OMNIPAQUE IOHEXOL 300 MG/ML  SOLN COMPARISON:  None. FINDINGS: Pharynx and larynx: Oral cavity within normal limits without mass lesion or loculated fluid collection. No acute abnormality about the dentition. Palatine tonsils prominent and mildly enlarged bilaterally with associated hyperenhancement and edema, suggesting acute tonsillitis. No discrete tonsillar or peritonsillar collection. Parapharyngeal fat maintained. Adenoidal month soft tissues mildly prominent as well. No retropharyngeal collection. Epiglottis normal. Vallecula clear. Remainder of  the hypopharynx and supraglottic larynx within normal limits. True cords normal. Subglottic airway clear. Salivary glands: Salivary glands including the parotid and submandibular glands are normal. Thyroid: Normal. Lymph nodes: Enlarged bilateral level II lymph nodes measure up to 16 mm, likely reactive. No other adenopathy within the neck. Vascular: Normal intravascular enhancement seen throughout the neck. Limited intracranial: Unremarkable. Visualized orbits: Unremarkable. Mastoids and visualized paranasal sinuses: Small left maxillary sinus retention cyst. Paranasal sinuses are otherwise clear. Mastoids and middle ear cavities are clear. Skeleton: Unremarkable. Upper chest: Unremarkable. Other: None. IMPRESSION: 1. Prominence of the palatine tonsils and adenoidal soft tissues,  suggesting acute tonsillitis. No discrete tonsillar or peritonsillar abscess. 2. Mildly enlarged bilateral level II lymph nodes, likely reactive. Electronically Signed   By: Rise Mu M.D.   On: 08/16/2017 01:33      Antony Madura, PA-C 08/16/17 0247    Gilda Crease, MD 08/16/17 [phone]

## 2017-08-16 NOTE — Discharge Instructions (Addendum)

## 2017-08-16 NOTE — ED Notes (Signed)
Pt ambulated to the bathroom, steady gait, no difficulty noted.  

## 2017-08-19 ENCOUNTER — Other Ambulatory Visit: Payer: Self-pay

## 2017-08-19 ENCOUNTER — Emergency Department (HOSPITAL_COMMUNITY)
Admission: EM | Admit: 2017-08-19 | Discharge: 2017-08-19 | Disposition: A | Discharge status: Home / Self Care | Payer: Self-pay | Attending: Emergency Medicine | Admitting: Emergency Medicine

## 2017-08-19 ENCOUNTER — Encounter (HOSPITAL_COMMUNITY): Payer: Self-pay | Admitting: Emergency Medicine

## 2017-08-19 DIAGNOSIS — Z79899 Other long term (current) drug therapy: Secondary | ICD-10-CM | POA: Insufficient documentation

## 2017-08-19 DIAGNOSIS — N939 Abnormal uterine and vaginal bleeding, unspecified: Secondary | ICD-10-CM

## 2017-08-19 DIAGNOSIS — N938 Other specified abnormal uterine and vaginal bleeding: Secondary | ICD-10-CM | POA: Insufficient documentation

## 2017-08-19 DIAGNOSIS — F17210 Nicotine dependence, cigarettes, uncomplicated: Secondary | ICD-10-CM | POA: Insufficient documentation

## 2017-08-19 LAB — URINALYSIS, ROUTINE W REFLEX MICROSCOPIC
BILIRUBIN URINE: NEGATIVE
Bacteria, UA: NONE SEEN
GLUCOSE, UA: NEGATIVE mg/dL
KETONES UR: NEGATIVE mg/dL
LEUKOCYTES UA: NEGATIVE
Nitrite: NEGATIVE
PH: 6 (ref 5.0–8.0)
PROTEIN: NEGATIVE mg/dL
Specific Gravity, Urine: 1.013 (ref 1.005–1.030)

## 2017-08-19 LAB — I-STAT BETA HCG BLOOD, ED (MC, WL, AP ONLY): I-stat hCG, quantitative: <5 m[IU]/mL (ref <5)

## 2017-08-19 LAB — POC URINE PREG, ED: Preg Test, Ur: NEGATIVE

## 2017-08-19 NOTE — ED Provider Notes (Signed)
I saw and evaluated the patient, reviewed the resident's note and I agree with the findings and plan with the following exceptions.   22 year old female that was seen here a few days ago for unrelated incident and told that she might be pregnant with a mildly elevated hCG.  She comes in today because she is having vaginal bleeding.  She states that this is the time of the month for normal.  But if she is pregnant it makes her more concerned. Exam patient appears well.  She has no symptoms of acute blood loss anemia.  She actually urinated prior to my evaluation states that there is no more bleeding. Pending repeat hCG which will likely be negative I think the last one was probably a lab error.  Also check urinalysis to make sure she does have a UTI.  If these are fine I think she stable for discharge with PRN follow-up.   Anastacio Bua, Barbara CowerJason, MD 08/19/17 [phone]

## 2017-08-19 NOTE — ED Provider Notes (Signed)
[facility] Provider Note   CSN: [id number] Arrival date & time: 08/19/17  0751  History   Chief Complaint Chief Complaint  Patient presents with  . Vaginal Bleeding    HPI Debra Becker is a 22 y.o. female with no significant past medical history who presents to the ED with vaginal bleeding that began yesterday in the setting of suspected pregnancy. Of note, the patient was seen in the ED on 7/20 for tonsillitis and started on PO clindamycin. At that time, an I-stat hcg was 9.3, but a urine pregnancy was negative. Her throat still feels sore, but is much improved. Her LMP was 6/28 and her next period was due today. She is normally very regular. She states that the bleeding she's currently having is lighter than the beginnings of her normal period. She reports that she only notices "bright pink" when she wipes and the bleeding isn't heavy enough for a pad or tampon. She endorses mild intermittent nausea over the past few days and a headache since the onset of her tonsillitis, and denies vomiting, abdominal pain, breast tenderness, vaginal discharge, or vaginal pain.   History reviewed. No pertinent past medical history.  There are no active problems to display for this patient.   Past Surgical History:  Procedure Laterality Date  . MOUTH SURGERY       OB History   None      Home Medications    Prior to Admission medications   Medication Sig Start Date End Date Taking? Authorizing Provider  acetaminophen (TYLENOL) 325 MG tablet Take 650 mg by mouth every 6 (six) hours as needed for mild pain.   Yes [provider]  clindamycin (CLEOCIN) 150 MG capsule Take 2 capsules (300 mg total) by mouth 3 (three) times daily for 10 days. 08/16/17 08/26/17 Yes Cristina GongHammond, Elizabeth W, PA-C  famotidine (PEPCID) 20 MG tablet Take 1 tablet (20 mg total) by mouth 2 (two) times daily. Patient not taking: Reported on 09/02/2015 04/05/14   Antony MaduraHumes, Kelly,  PA-C  HYDROcodone-acetaminophen (NORCO/VICODIN) 5-325 MG per tablet Take 2 tablets by mouth every 6 (six) hours as needed for moderate pain or severe pain. Patient not taking: Reported on 09/02/2015 03/13/14   Roxy HorsemanBrowning, Robert, PA-C  ibuprofen (ADVIL,MOTRIN) 800 MG tablet Take 1 tablet (800 mg total) by mouth 3 (three) times daily. Patient not taking: Reported on 09/02/2015 01/20/15   Anselm PancoastJoy, Shawn C, PA-C  phenazopyridine (PYRIDIUM) 95 MG tablet Take 1 tablet (95 mg total) by mouth 3 (three) times daily as needed for pain. Patient not taking: Reported on 08/19/2017 03/08/17   Dietrich PatesKhatri, Hina, PA-C    Family History No family history on file.  Social History Social History   Tobacco Use  . Smoking status: Current Every Day Smoker  . Smokeless tobacco: Never Used  Substance Use Topics  . Alcohol use: Yes    Comment: occ  . Drug use: No     Allergies   Chocolate   Review of Systems Review of Systems  Constitutional: Negative for chills and fever.  HENT: Positive for sore throat and trouble swallowing.   Eyes: Negative for visual disturbance.  Respiratory: Negative for cough and shortness of breath.   Cardiovascular: Negative for chest pain and leg swelling.  Gastrointestinal: Positive for nausea. Negative for abdominal pain and vomiting.  Genitourinary: Negative for difficulty urinating and dysuria.  Musculoskeletal: Negative for back pain and neck pain.  Skin: Negative for rash and wound.  Neurological: Positive  for headaches. Negative for dizziness.     Physical Exam Updated Vital Signs BP (!) 145/76   Pulse 62   Temp 98 F (36.7 C) (Oral)   Resp 16   LMP 07/24/2017 Comment: Negative HCG and U preg in ED today  SpO2 100%   Physical Exam  Constitutional: She is oriented to person, place, and time. She appears well-developed and well-nourished. No distress.  HENT:  Head: Normocephalic and atraumatic.  Right tonsillar adenopathy and erythema without exudate. Uvula is  midline. Thick white coating on tongue.   Eyes: Pupils are equal, round, and reactive to light. EOM are normal.  Neck: Normal range of motion.  Cardiovascular: Normal rate and regular rhythm. Exam reveals no gallop and no friction rub.  No murmur heard. Pulmonary/Chest: Effort normal and breath sounds normal. She has no wheezes. She has no rales.  Abdominal: Soft. Bowel sounds are normal. She exhibits no distension. There is no tenderness.  Musculoskeletal: She exhibits no edema.  Neurological: She is alert and oriented to person, place, and time.  Skin: Skin is warm and dry. Capillary refill takes less than 2 seconds. No rash noted.  Psychiatric: She has a normal mood and affect. Her behavior is normal.     ED Treatments / Results  Labs (all labs ordered are listed, but only abnormal results are displayed) Labs Reviewed  URINALYSIS, ROUTINE W REFLEX MICROSCOPIC - Abnormal; Notable for the following components:      Result Value   Hgb urine dipstick SMALL (*)    All other components within normal limits  I-STAT BETA HCG BLOOD, ED ([facility], [facility], [facility] ONLY)  POC URINE PREG, ED    EKG None  Radiology No results found.  Procedures Procedures (including critical care time)  Medications Ordered in ED Medications - No data to display   Initial Impression / Assessment and Plan / ED Course  I have reviewed the triage vital signs and the nursing notes.  Pertinent labs & imaging results that were available during my care of the patient were reviewed by me and considered in my medical decision making (see chart for details).  Debra Becker is a 22 y.o. female with no significant past medical history who presents to the ED with light vaginal spotting that began yesterday in the setting of suspected pregnancy. Upon arrival to the ED, patient was afebrile and hemodynamically stable. Physical exam was benign. Labs showed negative I-stat hcg and urine pregnancy. UA negative for infection.  Patient's vaginal bleeding is most likely her normal period given that her period is due and she is not pregnant. Patient medically safe for discharge home with return precautions of fever, severe vaginal bleeding, vaginal discharge, or any other new or concerning symptoms.   Final Clinical Impressions(s) / ED Diagnoses   Final diagnoses:  Vaginal spotting    ED Discharge Orders    None       Kanika Bungert, Cathleen Corti, MD 08/19/17 1329    Mesner, Barbara Cower, MD 08/19/17 1348    Mesner, Barbara Cower, MD 08/19/17 1610

## 2017-08-19 NOTE — Discharge Instructions (Addendum)
Your pregnancy tests today (both blood and urine) were negative. Your urine was also negative for an infection. The bleeding you're experiencing is likely a normal menstrual period. Please return to the ED if you have worsening bleeding, vaginal discharge, or pelvic pain.

## 2017-08-19 NOTE — ED Triage Notes (Signed)
Pt. Stated, I was here for the flu and I was told I was pregnant and yesterday I started bleeding. Used 2 pads yesterday.

## 2017-08-19 NOTE — ED Notes (Signed)
ED Provider at bedside. 

## 2017-08-21 LAB — CULTURE, BLOOD (ROUTINE X 2)
CULTURE: NO GROWTH
Culture: NO GROWTH
Special Requests: ADEQUATE
Special Requests: ADEQUATE

## 2018-12-04 ENCOUNTER — Emergency Department (HOSPITAL_COMMUNITY)
Admission: EM | Admit: 2018-12-04 | Discharge: 2018-12-04 | Disposition: A | Discharge status: Home / Self Care | Payer: Medicaid Other | Attending: Emergency Medicine | Admitting: Emergency Medicine

## 2018-12-04 ENCOUNTER — Emergency Department (HOSPITAL_COMMUNITY): Payer: Medicaid Other

## 2018-12-04 ENCOUNTER — Other Ambulatory Visit: Payer: Self-pay

## 2018-12-04 DIAGNOSIS — Z20828 Contact with and (suspected) exposure to other viral communicable diseases: Secondary | ICD-10-CM | POA: Insufficient documentation

## 2018-12-04 DIAGNOSIS — F17210 Nicotine dependence, cigarettes, uncomplicated: Secondary | ICD-10-CM | POA: Insufficient documentation

## 2018-12-04 DIAGNOSIS — R059 Cough, unspecified: Secondary | ICD-10-CM

## 2018-12-04 DIAGNOSIS — R05 Cough: Secondary | ICD-10-CM

## 2018-12-04 DIAGNOSIS — Z20822 Contact with and (suspected) exposure to covid-19: Secondary | ICD-10-CM

## 2018-12-04 LAB — SARS CORONAVIRUS 2 (TAT 6-24 HRS): SARS Coronavirus 2: NEGATIVE

## 2018-12-04 NOTE — ED Provider Notes (Signed)
[facility] EMERGENCY DEPARTMENT Provider Note   CSN: [id number] Arrival date & time: 12/04/18  1205     History   Chief Complaint Cough   HPI Debra Becker is a 23 y.o. female with no significant past medical history who presents for evaluation of cough and Covid exposure.  Patient states she was exposed to someone with a positive Covid test result 6 days ago.  Patient states she has had a productive cough of clear sputum x2 days.  States she did have a scratchy throat 24 hours ago however has none currently.  She is concerned that she has COVID-19.  She denies fever, chills, nausea, vomiting, congestion, rhinorrhea, chest pain, shortness of breath, mopped assist, abdominal pain, diarrhea, dysuria.  She has not had to take anything for symptoms.  Denies additional aggravating or alleviating factors. Tolerating PO intake at home without difficulty.  History obtained from patient and past medical records.  No interpreter is used.     HPI  No past medical history on file.  There are no active problems to display for this patient.   Past Surgical History:  Procedure Laterality Date  . MOUTH SURGERY       OB History   No obstetric history on file.      Home Medications    Prior to Admission medications   Medication Sig Start Date End Date Taking? Authorizing Provider  acetaminophen (TYLENOL) 325 MG tablet Take 650 mg by mouth every 6 (six) hours as needed for mild pain.    [provider]  famotidine (PEPCID) 20 MG tablet Take 1 tablet (20 mg total) by mouth 2 (two) times daily. Patient not taking: Reported on 09/02/2015 04/05/14   Antonietta Breach, PA-C  HYDROcodone-acetaminophen (NORCO/VICODIN) 5-325 MG per tablet Take 2 tablets by mouth every 6 (six) hours as needed for moderate pain or severe pain. Patient not taking: Reported on 09/02/2015 03/13/14   Montine Circle, PA-C  ibuprofen (ADVIL,MOTRIN) 800 MG tablet Take 1 tablet (800 mg total) by mouth  3 (three) times daily. Patient not taking: Reported on 09/02/2015 01/20/15   Lorayne Bender, PA-C  phenazopyridine (PYRIDIUM) 95 MG tablet Take 1 tablet (95 mg total) by mouth 3 (three) times daily as needed for pain. Patient not taking: Reported on 08/19/2017 03/08/17   Delia Heady, PA-C    Family History No family history on file.  Social History Social History   Tobacco Use  . Smoking status: Current Every Day Smoker  . Smokeless tobacco: Never Used  Substance Use Topics  . Alcohol use: Yes    Comment: occ  . Drug use: No     Allergies   Chocolate   Review of Systems Review of Systems  Constitutional: Negative.   HENT: Positive for sore throat. Negative for congestion, drooling, ear discharge, ear pain, facial swelling, mouth sores, nosebleeds, postnasal drip, rhinorrhea, sinus pressure, sinus pain, sneezing, trouble swallowing and voice change.   Eyes: Negative.   Respiratory: Positive for cough. Negative for apnea, choking, chest tightness, shortness of breath, wheezing and stridor.   Cardiovascular: Negative.   Genitourinary: Negative.   Musculoskeletal: Negative.   Neurological: Negative.   All other systems reviewed and are negative.    Physical Exam Updated Vital Signs BP (!) 158/80 (BP Location: Right Arm)   Pulse 90   Temp 98.6 F (37 C) (Oral)   Resp 16   SpO2 99%   Physical Exam Vitals signs and nursing note reviewed.  Constitutional:  General: She is not in acute distress.    Appearance: She is well-developed. She is not ill-appearing, toxic-appearing or diaphoretic.  HENT:     Head: Normocephalic and atraumatic.     Jaw: There is normal jaw occlusion.     Right Ear: Tympanic membrane, ear canal and external ear normal. There is no impacted cerumen. No hemotympanum. Tympanic membrane is not injected, scarred, perforated, erythematous, retracted or bulging.     Left Ear: Tympanic membrane, ear canal and external ear normal. There is no impacted  cerumen. No hemotympanum. Tympanic membrane is not injected, scarred, perforated, erythematous, retracted or bulging.     Ears:     Comments: No Mastoid tenderness.    Nose: Nose normal.     Comments: Clear rhinorrhea and congestion to bilateral nares.  No sinus tenderness.    Mouth/Throat:     Mouth: Mucous membranes are moist.     Pharynx: Oropharynx is clear.     Comments: Posterior oropharynx clear.  Mucous membranes moist.  Tonsils without erythema or exudate.  Uvula midline without deviation.  No evidence of PTA or RPA.  No drooling, dysphasia or trismus.  Phonation normal. Eyes:     Pupils: Pupils are equal, round, and reactive to light.  Neck:     Musculoskeletal: Normal range of motion.     Trachea: Trachea and phonation normal.     Meningeal: Brudzinski's sign and Kernig's sign absent.     Comments: No Neck stiffness or neck rigidity.  No meningismus.  No cervical lymphadenopathy. Cardiovascular:     Rate and Rhythm: Normal rate.     Comments: No murmurs rubs or gallops. Pulmonary:     Effort: No respiratory distress.     Comments: Clear to auscultation bilaterally without wheeze, rhonchi or rales.  No accessory muscle usage.  Able speak in full sentences. Abdominal:     General: There is no distension.     Comments: Soft, nontender without rebound or guarding.  No CVA tenderness.  Musculoskeletal: Normal range of motion.     Comments: Moves all 4 extremities without difficulty.  Lower extremities without edema, erythema or warmth.  Skin:    General: Skin is warm and dry.     Comments: Brisk capillary refill.  No rashes or lesions.  Neurological:     Mental Status: She is alert.     Comments: Ambulatory in department without difficulty.  Cranial nerves II through XII grossly intact.  No facial droop.  No aphasia.    ED Treatments / Results  Labs (all labs ordered are listed, but only abnormal results are displayed) Labs Reviewed  SARS CORONAVIRUS 2 (TAT 6-24 HRS)     EKG None  Radiology Dg Chest Portable 1 View  Result Date: 12/04/2018 CLINICAL DATA:  23 year old female with a history of cough EXAM: PORTABLE CHEST 1 VIEW COMPARISON:  04/05/2014 FINDINGS: Cardiomediastinal silhouette unchanged in size and contour. No evidence of central vascular congestion. No pneumothorax or pleural effusion. No confluent airspace disease. IMPRESSION: Negative for acute cardiopulmonary disease Electronically Signed   By: Gilmer Mor D.O.   On: 12/04/2018 12:53    Procedures Procedures (including critical care time)  Medications Ordered in ED Medications - No data to display  Initial Impression / Assessment and Plan / ED Course  I have reviewed the triage vital signs and the nursing notes.  Pertinent labs & imaging results that were available during my care of the patient were reviewed by me and considered in my  medical decision making (see chart for details).   23 year old female appears otherwise well presents for evaluation of cough.  Cough productive of clear sputum.  She is no evidence of DVT on exam and does not appear fluid overloaded.  She has had known exposure to COVID-19.  Had sore throat yesterday however none currently.  She has no drooling, dysphagia or trismus.  Uvula midline without deviation.  No evidence of PTA or RPA.  No swelling or exudate to tonsils.  Her heart and lungs are clear.  Abdomen soft.  She is without tachycardia, tachypnea or hypoxia.  She has no neck stiffness or neck rigidity.  No meningismus.  Likely viral infection.  Will do outpatient Covid testing.  Patient knows to self isolate until her testing has resulted.  Symptomatic management.  Patient to return for any new worsening symptoms. Chest xray without acute findings.       Bertram Galadrionna C Demelo was evaluated in Emergency Department on 12/04/2018 for the symptoms described in the history of present illness. She was evaluated in the context of the global COVID-19 pandemic, which  necessitated consideration that the patient might be at risk for infection with the SARS-CoV-2 virus that causes COVID-19. Institutional protocols and algorithms that pertain to the evaluation of patients at risk for COVID-19 are in a state of rapid change based on information released by regulatory bodies including the CDC and federal and state organizations. These policies and algorithms were followed during the patient's care in the ED. Final Clinical Impressions(s) / ED Diagnoses   Final diagnoses:  Close exposure to COVID-19 virus  Cough    ED Discharge Orders    None       Arvetta Araque A, PA-C 12/04/18 1258    Tegeler, Canary Brimhristopher J, MD 12/04/18 [phone]

## 2018-12-04 NOTE — ED Triage Notes (Signed)
Pt here for cough and sore throat x 2 days. Has been around someone who has been around his cousins who tested positive for covid.

## 2018-12-04 NOTE — Discharge Instructions (Signed)
Take over-the-counter cough syrup as needed for cough.  Do not take this if you could be pregnant.  If you develop worsening sore throat you can take Tylenol.  You need to stay out of work until your Covid test has resulted.  If positive you will need to stay out for 10 days from for stated symptom onset.

## 2019-01-07 ENCOUNTER — Other Ambulatory Visit: Payer: Self-pay

## 2019-01-07 ENCOUNTER — Inpatient Hospital Stay (HOSPITAL_COMMUNITY)
Admission: EM | Admit: 2019-01-07 | Discharge: 2019-01-07 | Disposition: A | Discharge status: Home / Self Care | Payer: Medicaid Other | Attending: Obstetrics and Gynecology | Admitting: Obstetrics and Gynecology

## 2019-01-07 DIAGNOSIS — Z3202 Encounter for pregnancy test, result negative: Secondary | ICD-10-CM

## 2019-01-07 DIAGNOSIS — N939 Abnormal uterine and vaginal bleeding, unspecified: Secondary | ICD-10-CM | POA: Insufficient documentation

## 2019-01-07 DIAGNOSIS — R109 Unspecified abdominal pain: Secondary | ICD-10-CM | POA: Insufficient documentation

## 2019-01-07 LAB — URINALYSIS, ROUTINE W REFLEX MICROSCOPIC
Bilirubin Urine: NEGATIVE
Glucose, UA: NEGATIVE mg/dL
Ketones, ur: NEGATIVE mg/dL
Leukocytes,Ua: NEGATIVE
Nitrite: NEGATIVE
Protein, ur: 100 mg/dL — AB
RBC / HPF: >50 RBC/hpf — ABNORMAL HIGH (ref 0–5)
Specific Gravity, Urine: 1.016 (ref 1.005–1.030)
pH: 6 (ref 5.0–8.0)

## 2019-01-07 LAB — POCT PREGNANCY, URINE: Preg Test, Ur: NEGATIVE

## 2019-01-07 NOTE — MAU Note (Signed)
PT called third time. Not in lobby.

## 2019-01-07 NOTE — Progress Notes (Signed)
Pt called nto MAU for lab work.  No response, pt no longer in lobby.

## 2019-01-07 NOTE — MAU Note (Addendum)
Pt was sent from [facility] ED  c/o vaginal bleeding at 0900 today. It was spotting, but now is heavy and having cramping. Pt is seeing clots. LMP 12/07/2018.  PT had positive pregnancy test on 12/4 & 12/8

## 2019-01-07 NOTE — MAU Note (Signed)
Lab called patient - not in lobby [id number] - Patient not in lobby - registration states patient went out to use the phone. Has not come back yet.

## 2019-01-07 NOTE — MAU Note (Signed)
Pt left before HCG drawn. She was called x3.

## 2019-01-07 NOTE — MAU Provider Note (Signed)
First Provider Initiated Contact with Patient 01/07/19 1538      S Debra Becker is a 23 y.o. No obstetric history on file. non-pregnant female who presents to MAU today with complaint of vaginal bleeding & abdominal cramping. Was supposed to start her period on 12/5. Had a faint positive HPT on 12/4 & 12/8. Started have abdominal cramping & vaginal bleeding this morning.    O BP 135/66 (BP Location: Right Arm)   Pulse 65   Temp 98.2 F (36.8 C) (Oral)   Resp 18   Ht 5\' 9"  (1.753 m)   Wt 133.3 kg   LMP 12/07/2018   SpO2 100% Comment: room air  BMI 43.40 kg/m  Physical Exam  Nursing note and vitals reviewed. Constitutional: She appears well-developed and well-nourished. No distress.  Respiratory: Effort normal. No respiratory distress.  Skin: She is not diaphoretic.  Psychiatric: She has a normal mood and affect. Her behavior is normal. Judgment and thought content normal.    A 1. Negative pregnancy test      P Pt had negative UPT but reports positive pregnancy tests at home x 2. Pt medically screened & discussed plan to collect HCG. When phlebotomy went to draw patient's blood she was no longer in the lobby. Patient called x3 and not in the lobby.  Left AMA.    Jorje Guild, NP 01/07/2019 6:18 PM

## 2019-06-06 ENCOUNTER — Encounter (HOSPITAL_COMMUNITY): Payer: Self-pay | Admitting: Emergency Medicine

## 2019-06-06 ENCOUNTER — Emergency Department (HOSPITAL_COMMUNITY)
Admission: EM | Admit: 2019-06-06 | Discharge: 2019-06-06 | Disposition: A | Discharge status: Home / Self Care | Payer: Medicaid Other | Attending: Emergency Medicine | Admitting: Emergency Medicine

## 2019-06-06 DIAGNOSIS — F17200 Nicotine dependence, unspecified, uncomplicated: Secondary | ICD-10-CM | POA: Insufficient documentation

## 2019-06-06 DIAGNOSIS — Z20822 Contact with and (suspected) exposure to covid-19: Secondary | ICD-10-CM | POA: Diagnosis not present

## 2019-06-06 DIAGNOSIS — J069 Acute upper respiratory infection, unspecified: Secondary | ICD-10-CM | POA: Insufficient documentation

## 2019-06-06 DIAGNOSIS — R05 Cough: Secondary | ICD-10-CM | POA: Diagnosis present

## 2019-06-06 LAB — SARS CORONAVIRUS 2 BY RT PCR (HOSPITAL ORDER, PERFORMED IN ~~LOC~~ HOSPITAL LAB): SARS Coronavirus 2: NEGATIVE

## 2019-06-06 MED ORDER — BENZONATATE 100 MG PO CAPS: 100.0000 mg | ORAL_CAPSULE | Freq: Two times a day (BID) | ORAL | 0 refills | Status: AC | PRN

## 2019-06-06 NOTE — ED Triage Notes (Signed)
Pt states 1 week ago she developed a sore throat and nasal congestion and 2 days later had a poc covid test at [facility] that was negative- pt now reports sore throat is better but now having a cough and feels congestion in her chest.

## 2019-06-06 NOTE — ED Provider Notes (Signed)
[facility] EMERGENCY DEPARTMENT Provider Note   CSN: [id number] Arrival date & time: 06/06/19  1028     History Chief Complaint  Patient presents with  . Cough  . Sore Throat    Debra Becker is a 24 y.o. female.  HPI HPI Comments: Debra Becker is a 24 y.o. female who presents to the Emergency Department complaining of a cough.  Patient states that about 1-1/2 weeks ago her symptoms started with a sore throat which has since alleviated.  She has a young son who recently started going back to school who was experiencing similar symptoms prior to her.  She then noted she was experiencing a productive coughing just after that has been persistent since then.  She states she is producing clear/yellow phlegm.  She has been taking Mucinex and Robitussin without significant relief.  She went to [facility] a week ago and had a POC COVID-19 test performed which was negative at the time.  She reports associated difficulty sleeping due to her cough as well as congestion.  She has a history of seasonal allergies but is not taking any medication for this currently.  She denies fevers, chills, abdominal pain, nausea, vomiting, diarrhea, constipation, dysuria, hematuria, syncope, dizziness.     History reviewed. No pertinent past medical history.  There are no problems to display for this patient.   Past Surgical History:  Procedure Laterality Date  . MOUTH SURGERY       OB History   No obstetric history on file.     No family history on file.  Social History   Tobacco Use  . Smoking status: Current Every Day Smoker  . Smokeless tobacco: Never Used  Substance Use Topics  . Alcohol use: Yes    Comment: occ  . Drug use: No    Home Medications Prior to Admission medications   Medication Sig Start Date End Date Taking? Authorizing Provider  acetaminophen (TYLENOL) 325 MG tablet Take 650 mg by mouth every 6 (six) hours as needed for mild pain.    [provider]  famotidine (PEPCID) 20 MG tablet Take 1 tablet (20 mg total) by mouth 2 (two) times daily. Patient not taking: Reported on 09/02/2015 04/05/14   Antony Madura, PA-C  HYDROcodone-acetaminophen (NORCO/VICODIN) 5-325 MG per tablet Take 2 tablets by mouth every 6 (six) hours as needed for moderate pain or severe pain. Patient not taking: Reported on 09/02/2015 03/13/14   Roxy Horseman, PA-C  ibuprofen (ADVIL,MOTRIN) 800 MG tablet Take 1 tablet (800 mg total) by mouth 3 (three) times daily. Patient not taking: Reported on 09/02/2015 01/20/15   Anselm Pancoast, PA-C  phenazopyridine (PYRIDIUM) 95 MG tablet Take 1 tablet (95 mg total) by mouth 3 (three) times daily as needed for pain. Patient not taking: Reported on 08/19/2017 03/08/17   Dietrich Pates, PA-C    Allergies    Chocolate  Review of Systems   Review of Systems  Constitutional: Negative for chills and fever.  HENT: Positive for congestion and postnasal drip. Negative for ear discharge, ear pain, facial swelling, rhinorrhea, sinus pressure, sinus pain, sore throat, trouble swallowing and voice change.   Respiratory: Positive for cough. Negative for shortness of breath.   Cardiovascular: Positive for chest pain (When coughing).  Gastrointestinal: Negative for abdominal pain, constipation, diarrhea, nausea and vomiting.  Genitourinary: Negative for dysuria and hematuria.  Neurological: Negative for dizziness, syncope and headaches.  Psychiatric/Behavioral: Positive for sleep disturbance.    Physical Exam Updated  Vital Signs BP 117/62 (BP Location: Left Arm)   Pulse 65   Temp 97.8 F (36.6 C) (Oral)   Resp 17   SpO2 98%   Physical Exam Vitals and nursing note reviewed.  Constitutional:      General: She is not in acute distress.    Appearance: She is well-developed. She is not ill-appearing, toxic-appearing or diaphoretic.  HENT:     Head: Normocephalic and atraumatic.     Right Ear: Tympanic membrane and ear canal normal.  No drainage or tenderness. No middle ear effusion. Tympanic membrane is not erythematous.     Left Ear: Tympanic membrane and ear canal normal. No drainage or tenderness.  No middle ear effusion. Tympanic membrane is not erythematous.     Nose: No congestion or rhinorrhea.     Comments: Erythematous nasal turbinates noted bilaterally    Mouth/Throat:     Mouth: Mucous membranes are moist. No oral lesions.     Pharynx: Oropharynx is clear. Uvula midline. No pharyngeal swelling, oropharyngeal exudate, posterior oropharyngeal erythema or uvula swelling.     Tonsils: No tonsillar exudate or tonsillar abscesses. 0 on the right. 0 on the left.  Eyes:     Extraocular Movements:     Right eye: Normal extraocular motion.     Left eye: Normal extraocular motion.     Conjunctiva/sclera: Conjunctivae normal.     Pupils: Pupils are equal, round, and reactive to light.  Cardiovascular:     Rate and Rhythm: Normal rate and regular rhythm.     Heart sounds: Normal heart sounds. No murmur. No friction rub. No gallop.   Pulmonary:     Effort: Pulmonary effort is normal. No respiratory distress.     Breath sounds: No stridor. Rhonchi (Diffuse and cleared with coughing) present. No wheezing or rales.  Abdominal:     Palpations: Abdomen is soft.     Tenderness: There is no abdominal tenderness.  Musculoskeletal:     Cervical back: Normal range of motion and neck supple.  Lymphadenopathy:     Cervical: No cervical adenopathy.  Skin:    General: Skin is warm and dry.     Capillary Refill: Capillary refill takes less than 2 seconds.  Neurological:     General: No focal deficit present.     Mental Status: She is alert and oriented to person, place, and time.  Psychiatric:        Mood and Affect: Mood normal.        Behavior: Behavior normal.    ED Results / Procedures / Treatments   Labs (all labs ordered are listed, but only abnormal results are displayed) Labs Reviewed - No data to  display  EKG None  Radiology No results found.  Procedures Procedures   Medications Ordered in ED Medications - No data to display  ED Course  I have reviewed the triage vital signs and the nursing notes.  Pertinent labs & imaging results that were available during my care of the patient were reviewed by me and considered in my medical decision making (see chart for details).    MDM Rules/Calculators/A&P                      Patient is a 24 year old female who presents with 1 week of persistent productive cough.  Her symptoms are worse at night.  She is having difficulty sleeping due to this.  Symptoms are consistent with a viral URI.  She also has a  history of seasonal allergies which may be compounding her symptoms.  She has been taking Mucinex and Robitussin without relief.  She had a POC COVID-19 test performed 1 week ago which was negative at the time.  Her physical exam is very reassuring.  There was some mild rhonchi in her lungs which cleared with coughing.  Otherwise her exam is benign.  Vital signs are stable.  She is afebrile.  Not tachycardic.  She is saturating well.  We discussed retesting the patient for COVID-19 and she was amenable.  I am going to prescribe her a short course of Tessalon Perles for her cough.  Because her allergies are also likely causing her symptoms, I would recommend Benadryl for difficulty sleeping and allergy symptoms.  If she finds she is not getting relief with Tessalon Perles I also recommended Delsym.  She understands if her COVID-19 test is positive she needs to quarantine for 10 days from symptom onset.  She understands she can return to the emergency department with any new or worsening symptoms.  Her questions were answered and she was amicable with the above plan.  Her vital signs are stable.  Patient discharged to home/self care.  Condition at discharge: Stable  Note: Portions of this report may have been transcribed using voice  recognition software. Every effort was made to ensure accuracy; however, inadvertent computerized transcription errors may be present.    Final Clinical Impression(s) / ED Diagnoses Final diagnoses:  Viral upper respiratory tract infection    Rx / DC Orders ED Discharge Orders         Ordered    benzonatate (TESSALON) 100 MG capsule  2 times daily PRN     06/06/19 1234           Placido Sou, PA-C 06/06/19 1312    Pricilla Loveless, MD 06/07/19 [phone]

## 2019-06-06 NOTE — Discharge Instructions (Addendum)
Per our discussion, I would recommend that you continue taking Mucinex and Robitussin as needed.  I am prescribing a medication called Tessalon Perles.  You can take these for your cough.  If you find you are not getting relief you can also try Delsym.  This can be purchased over-the-counter.  I would recommend Benadryl for symptoms of your allergies as well as difficulty sleeping.  I would recommend you start taking Claritin or Zyrtec daily for your allergies.  Feel free to return to the emergency department with any new or worsening symptoms.  Please be sure to check the results of your COVID-19 test on MyChart.  It was a pleasure to meet you.

## 2021-09-10 IMAGING — DX DG CHEST 1V PORT
1 series · 1 of 1 positions shown · non-contrast
Comparison: 04/05/2014

CLINICAL DATA: 23-year-old female with a history of cough

EXAM:
PORTABLE CHEST 1 VIEW

[chest ap]
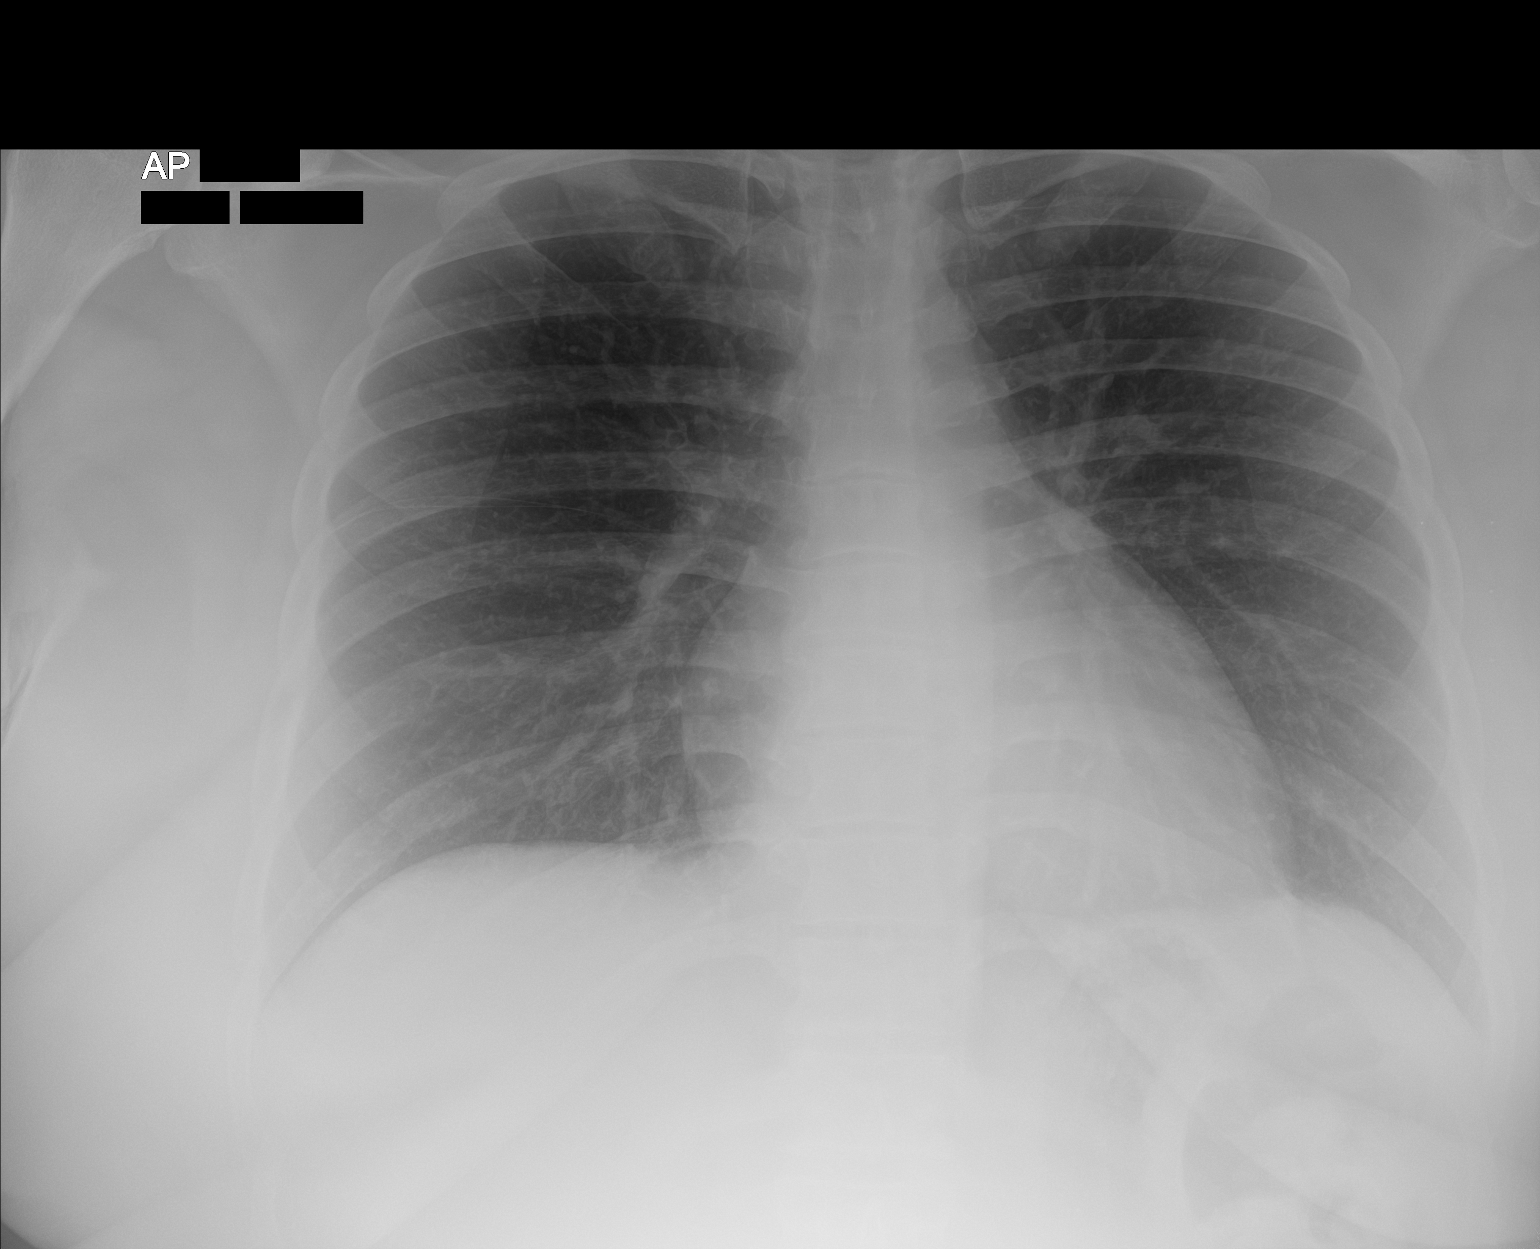

[1 of 1 positions shown; findings below may reference images not displayed]

FINDINGS: Cardiomediastinal silhouette unchanged in size and contour. No
evidence of central vascular congestion. No pneumothorax or pleural
effusion. No confluent airspace disease.
IMPRESSION: Negative for acute cardiopulmonary disease

## 2023-05-16 ENCOUNTER — Encounter (HOSPITAL_COMMUNITY): Payer: Self-pay | Admitting: Obstetrics & Gynecology

## 2023-05-16 ENCOUNTER — Inpatient Hospital Stay (HOSPITAL_COMMUNITY)
Admission: AD | Admit: 2023-05-16 | Discharge: 2023-05-16 | Disposition: A | Discharge status: Home / Self Care | Attending: Obstetrics & Gynecology | Admitting: Obstetrics & Gynecology

## 2023-05-16 DIAGNOSIS — G510 Bell's palsy: Secondary | ICD-10-CM | POA: Diagnosis not present

## 2023-05-16 DIAGNOSIS — O26893 Other specified pregnancy related conditions, third trimester: Secondary | ICD-10-CM

## 2023-05-16 DIAGNOSIS — H6690 Otitis media, unspecified, unspecified ear: Secondary | ICD-10-CM | POA: Diagnosis not present

## 2023-05-16 DIAGNOSIS — Z3A29 29 weeks gestation of pregnancy: Secondary | ICD-10-CM | POA: Diagnosis not present

## 2023-05-16 DIAGNOSIS — O99893 Other specified diseases and conditions complicating puerperium: Secondary | ICD-10-CM | POA: Diagnosis not present

## 2023-05-16 DIAGNOSIS — R29810 Facial weakness: Secondary | ICD-10-CM | POA: Diagnosis present

## 2023-05-16 DIAGNOSIS — J329 Chronic sinusitis, unspecified: Secondary | ICD-10-CM | POA: Insufficient documentation

## 2023-05-16 HISTORY — DX: Personal history of other diseases of the nervous system and sense organs: Z86.69

## 2023-05-16 HISTORY — DX: Essential (primary) hypertension: I10

## 2023-05-16 MED ORDER — CYCLOBENZAPRINE HCL 5 MG PO TABS
10.0000 mg | ORAL_TABLET | Freq: Once | ORAL | Status: AC
  Administered 2023-05-16: 10 mg via ORAL
  Filled 2023-05-16: qty 2

## 2023-05-16 MED ORDER — ACETAMINOPHEN 500 MG PO TABS
1000.0000 mg | ORAL_TABLET | Freq: Once | ORAL | Status: AC
  Administered 2023-05-16: 1000 mg via ORAL
  Filled 2023-05-16: qty 2

## 2023-05-16 MED ORDER — PREDNISONE 10 MG PO TABS: 60.0000 mg | ORAL_TABLET | Freq: Every day | ORAL | 0 refills | Status: AC

## 2023-05-16 MED ORDER — GUAIFENESIN ER 600 MG PO TB12
600.0000 mg | ORAL_TABLET | Freq: Two times a day (BID) | ORAL | Status: DC
  Administered 2023-05-16: 600 mg via ORAL
  Filled 2023-05-16: qty 1

## 2023-05-16 NOTE — MAU Note (Addendum)
.  Debra Becker is a 28 y.o. at Unknown here in MAU reporting: has had sinus infection since Monday-seen at Urgent Care yesterday in [place] and was started on Amoxicillin,   R side of face, eye, corer of her mouth, and jaw line has a feeling "between numb and tingly" Pt also stated she was diagnosed with ear infection in R ear. 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 Endorses + fetal movement Taking Tylenol , hot and cold compresses, Robitussin  LMP: 10/19/2022 Onset of complaint: Monday 4 /13 Pain score: Behind R ear and up back of her head Vitals:   05/16/23 2047  BP: (!) 140/79  Pulse: 90  Resp: 18  Temp: 98 F (36.7 C)  SpO2: 100%     FHT: 143bpm  Lab orders placed from triage: None

## 2023-05-16 NOTE — MAU Provider Note (Signed)
 History     CSN: [id number]  Arrival date and time: 05/16/23 2017   Event Date/Time   First Provider Initiated Contact with Patient 05/16/23 2233      Chief Complaint  Patient presents with   Debra Becker , a  28 y.o. G3P1011 at 103w6d presents to MAU with complaints of right sided facial drooping. Patient was recently diagnosed with a sinus and ear infection and is currently on antibiotics. She states that this morning she noted that her smile was a little lazy on the right side. She states that throughout the day her family and friends started to notice as well. She denies headache, visual disturbances, numbness and tingling. She denies any other right sided weakness. She has no other complaints. Patient reports a history of bells palsy at the age of 54.        Otalgia  Associated symptoms include rhinorrhea. Pertinent negatives include no abdominal pain, diarrhea, headaches or vomiting.    OB History     Gravida  3   Para  1   Term  1   Preterm      AB  1   Living  1      SAB      IAB  1   Ectopic      Multiple      Live Births  1           Past Medical History:  Diagnosis Date   Chronic hypertension    Hx of Bell's palsy    Age 24    Past Surgical History:  Procedure Laterality Date   MOUTH SURGERY      Family History  Problem Relation Age of Onset   Hypertension Mother    Diabetes Mother    Diabetes Father     Social History   Tobacco Use   Smoking status: Every Day   Smokeless tobacco: Never  Vaping Use   Vaping status: Former   Devices: not cince confirmed pregnancy  Substance Use Topics   Alcohol use: Not Currently    Comment: not since confirmed pregnancy   Drug use: No    Allergies:  Allergies  Allergen Reactions   Chocolate Hives    Dark Chocolate     Medications Prior to Admission  Medication Sig Dispense Refill Last Dose/Taking   acetaminophen  (TYLENOL ) 325 MG tablet Take 650 mg by mouth every 6  (six) hours as needed for mild pain.   05/16/2023 Noon   amoxicillin (AMOXIL) 875 MG tablet Take 875 mg by mouth 2 (two) times daily.   05/16/2023 Morning   aspirin 81 MG chewable tablet Chew 81 mg by mouth daily.   Past Week   Prenatal MV & Min w/FA-DHA (PRENATAL GUMMIES PO) Take 2 tablets by mouth daily.   05/16/2023 Noon   benzonatate  (TESSALON ) 100 MG capsule Take 1 capsule (100 mg total) by mouth 2 (two) times daily as needed for cough. 21 capsule 0    famotidine  (PEPCID ) 20 MG tablet Take 1 tablet (20 mg total) by mouth 2 (two) times daily. (Patient not taking: Reported on 09/02/2015) 30 tablet 0 Not Taking   HYDROcodone -acetaminophen  (NORCO/VICODIN) 5-325 MG per tablet Take 2 tablets by mouth every 6 (six) hours as needed for moderate pain or severe pain. (Patient not taking: Reported on 09/02/2015) 15 tablet 0 Not Taking   ibuprofen  (ADVIL ,MOTRIN ) 800 MG tablet Take 1 tablet (800 mg total) by mouth 3 (three) times daily. (Patient not  taking: Reported on 09/02/2015) 21 tablet 0 Not Taking   phenazopyridine  (PYRIDIUM ) 95 MG tablet Take 1 tablet (95 mg total) by mouth 3 (three) times daily as needed for pain. (Patient not taking: Reported on 08/19/2017) 10 tablet 0 Not Taking    Review of Systems  Constitutional:  Negative for chills, fatigue and fever.  HENT:  Positive for ear pain, rhinorrhea, sinus pressure and sinus pain.   Eyes:  Negative for pain and visual disturbance.  Respiratory:  Negative for apnea, shortness of breath and wheezing.   Cardiovascular:  Negative for chest pain and palpitations.  Gastrointestinal:  Negative for abdominal pain, constipation, diarrhea, nausea and vomiting.  Genitourinary:  Negative for difficulty urinating, dysuria, pelvic pain, vaginal bleeding, vaginal discharge and vaginal pain.  Musculoskeletal:  Negative for back pain.  Neurological:  Positive for facial asymmetry. Negative for seizures, weakness and headaches.  Psychiatric/Behavioral:  Negative for  suicidal ideas.    Physical Exam   Blood pressure (!) 140/79, pulse 90, temperature 98 F (36.7 C), temperature source Oral, resp. rate 18, height 5\' 9"  (1.753 m), weight (!) 152 kg, last menstrual period 10/19/2022, SpO2 100%.  Physical Exam Vitals and nursing note reviewed.  Constitutional:      General: She is not in acute distress.    Appearance: Normal appearance. She is ill-appearing.  HENT:     Head: Normocephalic.     Nose: Congestion present.     Mouth/Throat:     Comments: Small amount of facial drooping noted to the right jaw and lip. Occasionally patient unable to raise right eyebrow to the same height of the left. Non-tender to palpation.  Pulmonary:     Effort: Pulmonary effort is normal.  Musculoskeletal:     Cervical back: Normal range of motion.  Skin:    General: Skin is warm and dry.  Neurological:     Mental Status: She is alert and oriented to person, place, and time.  Psychiatric:        Mood and Affect: Mood normal.    FHT obtained in triage   MAU Course  Procedures Orders Placed This Encounter  Procedures   Discharge patient Discharge disposition: 01-Home or Self Care; Discharge patient date: 05/16/2023   Meds ordered this encounter  Medications   acetaminophen  (TYLENOL ) tablet 1,000 mg   cyclobenzaprine  (FLEXERIL ) tablet 10 mg   DISCONTD: guaiFENesin  (MUCINEX ) 12 hr tablet 600 mg   predniSONE  (DELTASONE ) 10 MG tablet    Sig: Take 6 tablets (60 mg total) by mouth daily for 7 days.    Dispense:  42 tablet    Refill:  0    Supervising Provider:   PRATT, TANYA S [[id number]]    MDM - Cranial Nerves intact,  - Mucinex  given for nasal congestion.  - Tylenol  and flexeril  given for neck and ear pain related to ear infection.  -Given patient history and intact cardial nerves without deficits,  Concern for bells palsy.  - plan for discharge.    Assessment and Plan   1. Bell's palsy   2. Sinusitis, unspecified chronicity, unspecified location   3.  Ear infection   4. [redacted] weeks gestation of pregnancy    - Reviewed Bells Palsy and expectations,  - Rx for 7 days course of prednisone  ordered. Discussed to continue amoxicillin for sinus and ear infection.  - Reviewed OTC options for symptom relief for nasal congestion and infection.  - Reviewed worsening signs and return precautions.  - Patient discharged home in stable  condition and mat return to MAU as needed.   Corie Diamond, MSN CNM  05/16/2023, 10:33 PM
